# Patient Record
Sex: Male | Born: 1990 | Race: Black or African American | Hispanic: No | Marital: Single | Smoking: Current every day smoker
Health system: Southern US, Community
[De-identification: ages and names within clinical notes are randomized; demographics above are authoritative.]

---
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
## 2014-12-03 ENCOUNTER — Emergency Department (HOSPITAL_COMMUNITY)
Admission: EM | Admit: 2014-12-03 | Discharge: 2014-12-03 | Disposition: A | Discharge status: Home / Self Care | Payer: Self-pay | Attending: Emergency Medicine | Admitting: Emergency Medicine

## 2014-12-03 ENCOUNTER — Encounter (HOSPITAL_COMMUNITY): Payer: Self-pay | Admitting: Emergency Medicine

## 2014-12-03 ENCOUNTER — Emergency Department (HOSPITAL_COMMUNITY)
Admission: EM | Admit: 2014-12-03 | Discharge: 2014-12-04 | Disposition: A | Discharge status: Home / Self Care | Payer: Self-pay | Attending: Emergency Medicine | Admitting: Emergency Medicine

## 2014-12-03 ENCOUNTER — Encounter (HOSPITAL_COMMUNITY): Payer: Self-pay

## 2014-12-03 DIAGNOSIS — R6883 Chills (without fever): Secondary | ICD-10-CM | POA: Insufficient documentation

## 2014-12-03 DIAGNOSIS — Z720 Tobacco use: Secondary | ICD-10-CM | POA: Insufficient documentation

## 2014-12-03 DIAGNOSIS — F1120 Opioid dependence, uncomplicated: Secondary | ICD-10-CM | POA: Insufficient documentation

## 2014-12-03 DIAGNOSIS — F1910 Other psychoactive substance abuse, uncomplicated: Secondary | ICD-10-CM

## 2014-12-03 DIAGNOSIS — F1110 Opioid abuse, uncomplicated: Secondary | ICD-10-CM | POA: Insufficient documentation

## 2014-12-03 LAB — URINALYSIS, ROUTINE W REFLEX MICROSCOPIC
BILIRUBIN URINE: NEGATIVE
GLUCOSE, UA: NEGATIVE mg/dL
Hgb urine dipstick: NEGATIVE
Ketones, ur: NEGATIVE mg/dL
Leukocytes, UA: NEGATIVE
Nitrite: NEGATIVE
PROTEIN: NEGATIVE mg/dL
Specific Gravity, Urine: 1.014 (ref 1.005–1.030)
UROBILINOGEN UA: 0.2 mg/dL (ref 0.0–1.0)
pH: 7 (ref 5.0–8.0)

## 2014-12-03 LAB — COMPREHENSIVE METABOLIC PANEL
ALK PHOS: 63 U/L (ref 38–126)
ALT: 22 U/L (ref 17–63)
AST: 18 U/L (ref 15–41)
Albumin: 3.8 g/dL (ref 3.5–5.0)
Anion gap: 9 (ref 5–15)
BUN: 8 mg/dL (ref 6–20)
CALCIUM: 9.3 mg/dL (ref 8.9–10.3)
CO2: 24 mmol/L (ref 22–32)
CREATININE: 0.92 mg/dL (ref 0.61–1.24)
Chloride: 108 mmol/L (ref 101–111)
GFR calc Af Amer: >60 mL/min (ref >60)
GFR calc non Af Amer: >60 mL/min (ref >60)
Glucose, Bld: 100 mg/dL — ABNORMAL HIGH (ref 65–99)
Potassium: 3.9 mmol/L (ref 3.5–5.1)
Sodium: 141 mmol/L (ref 135–145)
Total Bilirubin: 0.7 mg/dL (ref 0.3–1.2)
Total Protein: 7.5 g/dL (ref 6.5–8.1)

## 2014-12-03 LAB — CBC WITH DIFFERENTIAL/PLATELET
BASOS ABS: 0 10*3/uL (ref 0.0–0.1)
BASOS PCT: 0 % (ref 0–1)
Eosinophils Absolute: 0.1 10*3/uL (ref 0.0–0.7)
Eosinophils Relative: 2 % (ref 0–5)
HCT: 40.4 % (ref 39.0–52.0)
HEMOGLOBIN: 13.5 g/dL (ref 13.0–17.0)
LYMPHS ABS: 2.6 10*3/uL (ref 0.7–4.0)
Lymphocytes Relative: 36 % (ref 12–46)
MCH: 26.4 pg (ref 26.0–34.0)
MCHC: 33.4 g/dL (ref 30.0–36.0)
MCV: 79.1 fL (ref 78.0–100.0)
MONO ABS: 0.6 10*3/uL (ref 0.1–1.0)
Monocytes Relative: 8 % (ref 3–12)
Neutro Abs: 3.9 10*3/uL (ref 1.7–7.7)
Neutrophils Relative %: 54 % (ref 43–77)
PLATELETS: 250 10*3/uL (ref 150–400)
RBC: 5.11 MIL/uL (ref 4.22–5.81)
RDW: 12.5 % (ref 11.5–15.5)
WBC: 7.2 10*3/uL (ref 4.0–10.5)

## 2014-12-03 LAB — RAPID URINE DRUG SCREEN, HOSP PERFORMED
AMPHETAMINES: NOT DETECTED
Barbiturates: NOT DETECTED
Benzodiazepines: NOT DETECTED
COCAINE: NOT DETECTED
Opiates: POSITIVE — AB
TETRAHYDROCANNABINOL: NOT DETECTED

## 2014-12-03 LAB — LIPASE, BLOOD: Lipase: 29 U/L (ref 22–51)

## 2014-12-03 MED ORDER — ONDANSETRON HCL 4 MG PO TABS: 4.0000 mg | ORAL_TABLET | Freq: Three times a day (TID) | ORAL | Status: DC | PRN

## 2014-12-03 MED ORDER — ACETAMINOPHEN 325 MG PO TABS
650.0000 mg | ORAL_TABLET | ORAL | Status: DC | PRN
  Administered 2014-12-03: 650 mg via ORAL
  Filled 2014-12-03: qty 2

## 2014-12-03 MED ORDER — NICOTINE 21 MG/24HR TD PT24
21.0000 mg | MEDICATED_PATCH | Freq: Every day | TRANSDERMAL | Status: DC
  Administered 2014-12-03: 21 mg via TRANSDERMAL
  Filled 2014-12-03: qty 1

## 2014-12-03 NOTE — ED Notes (Signed)
Patient here for heroine detox, last use yesterday. Pt states he also uses cocaine, but is here for heroine detox. Pt denies any drug or ETOH use today.

## 2014-12-03 NOTE — Discharge Instructions (Signed)
Drug Abuse and Addiction  There are many types of drugs that one may become addicted to including illegal drugs (marijuana, cocaine, amphetamines, hallucinogens, and narcotics), prescription drugs (hydrocodone, codeine, and alprazolam), and other chemicals such as alcohol or nicotine. Two types of addiction exist: physical and emotional. Physical addiction usually occurs after prolonged use of a drug. However, some drugs may only take a couple uses before addiction can occur. Physical addiction is marked by withdrawal symptoms in which the person experiences negative symptoms such as sweat, anxiety, tremors, hallucinations, or cravings in the absence of using the drug. Emotional dependence is the psychological desire for the "high" that the drugs produce when taken. SYMPTOMS   Inattentiveness.  Negligence.  Forgetfulness.  Insomnia.  Mood swings. RISK INCREASES WITH:   Family history of addiction.  Personal history of addictive personality. Studies have shown that risk takers, which many athletes are, have a higher risk of addiction. PREVENTION The only adequate prevention of drug abuse is abstinence from drugs. TREATMENT  The first step in quitting substance abuse is recognizing the problem and realizing that one has the power to change. Quitting requires a plan and support from others. It is often necessary to seek medical assistance. Caregivers are available to offer counseling, and for certain cases, medicine to diminish the physical symptoms of withdrawal. Many organizations exist such as Alcoholics Anonymous, Narcotics Anonymous, or the [employer] on Alcoholism that offer support for individuals who have chosen to quit their habits. Document Released: 06/15/2005 Document Revised: 10/30/2013 Document Reviewed: 09/27/2008 [facility] Patient Information 2015 [place], [place]. This information is not intended to replace advice given to you by your health care provider. Make sure you  discuss any questions you have with your health care provider.  Emergency Department Resource Guide 1) Find a Doctor and Pay Out of Pocket Although you won't have to find out who is covered by your insurance plan, it is a good idea to ask around and get recommendations. You will then need to call the office and see if the doctor you have chosen will accept you as a new patient and what types of options they offer for patients who are self-pay. Some doctors offer discounts or will set up payment plans for their patients who do not have insurance, but you will need to ask so you aren't surprised when you get to your appointment.  2) Contact Your Local Health Department Not all health departments have doctors that can see patients for sick visits, but many do, so it is worth a call to see if yours does. If you don't know where your local health department is, you can check in your phone book. The CDC also has a tool to help you locate your state's health department, and many state websites also have listings of all of their local health departments.  3) Find a Walk-in Clinic If your illness is not likely to be very severe or complicated, you may want to try a walk in clinic. These are popping up all over the country in pharmacies, drugstores, and shopping centers. They're usually staffed by nurse practitioners or physician assistants that have been trained to treat common illnesses and complaints. They're usually fairly quick and inexpensive. However, if you have serious medical issues or chronic medical problems, these are probably not your best option.  No Primary Care Doctor: - Call Health Connect at  [phone] - they can help you locate a primary care doctor that  accepts your insurance, provides certain services,  etc. - Physician Referral Service- [phone]  Chronic Pain Problems: Organization         Address  Phone   Notes  [facility]  [phone] Patients need to be  referred by their primary care doctor.   Medication Assistance: Organization         Address  Phone   Notes  [facility] Medication [facility] [address] [place], [place] [address] [phone] --Must be a resident of [facility] -- Must have NO insurance coverage whatsoever (no Medicaid/ Medicare, etc.) -- The pt. MUST have a primary care doctor that directs their care regularly and follows them in the community   [facility]  [phone]   [employer]  [phone]    Agencies that provide inexpensive medical care: Organization         Address  Phone   Notes  [facility]  [phone]   [facility]    [phone]   [facility] [address] [place], [place] [address] [phone]   [facility] [address] [place] [place] [phone]   Planned Parenthood    [phone]   [facility]    [phone]   [facility]  [address], [place] Phone:  [phone], Fax:  [phone] Hours of Operation:  9 am - 6 pm, M-F.  Also accepts Medicaid/Medicare and self-pay.  [facility]  [address], [place] Phone: [phone], Fax: [phone]. Hours of Operation:  8:30 am - 5:30 pm, M-F.  Also accepts Medicaid and self-pay.  [facility] [place] [address], [place] Phone: [phone]   [facility] [address] Natasha Bence [place], [place] [phone], Ext. [phone] Mondays & Thursdays: 7-9 AM.  First 15 patients are seen on a first come, first serve basis.    Medicaid-accepting [facility] Providers:  Organization         Address  Phone   Notes  [facility] [address], [place] [phone] Also accepts self-pay patients.  [facility] [address] Laurell Josephs [place], [place]  [phone]   [facility] [address], [place] [phone]   [facility] [address], [place] [phone]   Renaye Rakers [address], [place]   [phone] Only accepts [place] patients after they have their name applied to their card.   Self-Pay (no insurance) in [facility]:  Organization         Address  Phone   Notes  Sickle Cell Patients, [facility] [address] [place], [place] [phone]   [facility] Care [address] [place], [place] [phone]   [facility]  [address], [place] [phone]   [facility]/Dr. Osei-Bonsu  [address] [place] or [address], [place] [phone] Phone number for both [place] and [place] locations is the same.  [facility] [address] [place] [phone]   [facility] [address] [place] or [address] [phone]   [facility] [address] [place], [place] (  [phone], phone; [phone], fax Sees patients 1st and 3rd Saturday of every month.  Must not qualify for public or private insurance (i.e. Medicaid, Medicare, [place] Health Choice, Veterans' Benefits)  Household income should be no more than 200% of the poverty level The clinic cannot treat you if you are pregnant or think you are pregnant  Sexually transmitted diseases are not treated at the clinic.    Dental Care: Organization         Address  Phone  Notes  [facility] Department of [facility] [address] [place], [place] [phone] Accepts children up to age 58 who are enrolled in [place] or [place] Health Choice; pregnant women with a Medicaid card; and children who have applied for Medicaid or [place] Health Choice, but were declined, whose parents can  pay a reduced fee at time of service.  [facility] Department of [facility]  [address], [place] [phone] Accepts children up to age 77 who are enrolled in [place] or [place] Health Choice; pregnant women with a Medicaid card; and children who have applied for Medicaid or [place] Health Choice, but were declined, whose parents can pay a reduced fee at time of service.  [facility]  [address] [place], [place] [phone] Patients are seen by appointment only. Walk-ins are not accepted. [facility] will see patients 25 years of age and older. Monday - Tuesday (8am-5pm) Most Wednesdays (8:30-5pm) $30 per visit, cash only  [facility] Adult Dental Access PRO[facility]  [address], [facility] [phone] Patients are seen by appointment only. Walk-ins are not accepted. [facility] will see patients 63 years of age and older. One Wednesday Evening (Monthly: Volunteer Based).  $30 per visit, cash only  [employer]  [phone] for adults; Children under age 71, call Graduate Pediatric Dentistry at [phone]. Children aged 50-14, please call [phone] to request a pediatric application.  Dental services are provided in all areas of dental care including fillings, crowns and bridges, complete and partial dentures, implants, gum treatment, root canals, and extractions. Preventive care is also provided. Treatment is provided to both adults and children. Patients are selected via a lottery and there is often a waiting list.   [facility] [address] [place]  [phone] [email or web address]   [facility] [address] [place], [place] [phone], Ext. [phone] Second and Fourth Thursday of each month, opens at 6:30 AM; Clinic ends at 9 AM.  Patients are seen on a first-come first-served basis, and a limited number are seen during each clinic.   [facility]  [address] Ether Griffins [place], [place] [phone]   Eligibility Requirements You must have lived in [place], [place], or [place] counties for at least the last three months.   You cannot be eligible for state or federal sponsored [place], including [employer], [place], or [employer].   You generally cannot be eligible for healthcare insurance through your employer.    How to apply: Eligibility screenings are held every Tuesday and Wednesday afternoon from 1:00 pm until 4:00 pm. You do not need an appointment for the interview!  [facility] [address], [place], [place] [phone]   [facility] Health Department  [phone]   [facility] Health Department  [phone]   [facility] Health Department  [phone]    Behavioral Health  Resources in the Community: Intensive Outpatient Programs Organization         Address  Phone  Notes  [facility] [address], [place], [place] [phone]   [facility] [address], [place], [place] [phone]   [facility] [address], [place], [place]  [phone]   [facility] [address],  [place], [place] [phone] or [phone]   Substance Abuse Resources Organization         Address  Phone  Notes  [facility]  [phone]   [facility]  [phone]   [facility]  [phone]   [facility]  [phone]   Residential & Outpatient Substance Abuse Program  [phone]   Psychological Services Organization         Address  Phone  Notes  [facility]  [phone]- [phone]   [facility]  [phone]- [phone]   [facility] [address], [place] [phone] or [phone]    Mobile Crisis Teams Organization         Address  Phone  Notes  [facility], Mobile Crisis Care Unit  [phone]    [facility]  [address] [place], [place] [phone]   Doristine LocksSharon DeEsch [address] [place] [phone]    Self-Help/Support Groups Organization         Address  Phone             Notes  [facility] [place] - variety of support groups  336- [phone] Call for more information  Narcotics Anonymous (NA), [facility] [address], [place]  2 meetings at this location   Statisticianesidential Treatment Programs Organization         Address  Phone  Notes  [facility] [address],    [place]  [phone]   [facility]  [address], [place] [phone], [place], [place] [phone]   [facility] [address], [place] [phone] Admissions: 8am-3pm M-F  [facility] [address]    [place], [place] [phone]   [facility] [address], [place], [place] [phone]   [facility] [address]  [place], [place] [phone]   [facility] - Intensive Outpatient [address], [place], [place] [phone]   [facility] ([facility] [address]  [place], [place] [phone] or [phone]   [facility] ([facility]) [address] [place], [place] [phone] Accepts Medicaid  [facility] [address]  [place] [phone] Substance Abuse/Addiction Treatment   [facility] Resources Organization         Address  Phone  Notes  [facility]  [phone]) [phone]   Angie FavaJulie Brannon, PhD [address] [place], [place]   [phone]) [phone] or [phone]   [facility]   [address] [place], [place] [phone]) [phone]   [facility] [address], [place], [place] [phone] Insurance/Medicaid/sponsorship through [facility]  [facility] [address]                                     [place], [place] [phone] Therapy/tele-psych/case  [facility] [address]   [place], [place] (  [phone]    Dr. Lolly Mustache  [phone]   [facility]  [employer] [facility] Dept. 1) [address] [place] 2) [address] [place] 3)  [address], [place] [phone]  [phone]   [facility] Child Abuse Hotline [phone] or [phone] (After Hours)

## 2014-12-03 NOTE — ED Notes (Signed)
Pt was very upset and angry with PA, requesting more resources. Did not wait for discharge and follow up instructions

## 2014-12-03 NOTE — ED Provider Notes (Signed)
CSN: [id number]     Arrival date & time 12/03/14  1853 History   First MD Initiated Contact with Patient 12/03/14 2100     Chief Complaint  Patient presents with  . Detox Heroine      (Consider location/radiation/quality/duration/timing/severity/associated sxs/prior Treatment) HPI Comments: The patient returns to the ED for help with dependence on heroin. He has been using for the past 6 months intravenously. No complaint of pain or redness at injection sites. He last used yesterday. He is having symptoms of nausea, vomiting, myalgias and agitation. No fever.   The history is provided by the patient.    History reviewed. No pertinent past medical history. History reviewed. No pertinent past surgical history. No family history on file. History  Substance Use Topics  . Smoking status: Current Every Day Smoker -- 1.00 packs/day    Types: Cigarettes  . Smokeless tobacco: Not on file  . Alcohol Use: Yes    Review of Systems  Constitutional: Negative for fever and chills.  HENT: Negative.   Respiratory: Negative.   Cardiovascular: Negative.   Gastrointestinal: Positive for nausea, vomiting and abdominal pain.  Musculoskeletal: Positive for myalgias.  Skin: Negative.   Neurological: Negative.       Allergies  Review of patient's allergies indicates no known allergies.  Home Medications   Prior to Admission medications   Not on File   BP 135/85 mmHg  Temp(Src) 98.2 F (36.8 C) (Oral)  Resp 19  SpO2 99% Physical Exam  Constitutional: He is oriented to person, place, and time. He appears well-developed and well-nourished.  HENT:  Head: Normocephalic.  Neck: Normal range of motion. Neck supple.  Cardiovascular: Normal rate and regular rhythm.   Pulmonary/Chest: Effort normal and breath sounds normal.  Abdominal: Soft. Bowel sounds are normal. There is no tenderness. There is no rebound and no guarding.  Musculoskeletal: Normal range of motion.  Neurological: He is  alert and oriented to person, place, and time.  Skin: Skin is warm and dry. No rash noted.  Psychiatric: He has a normal mood and affect.    ED Course  Procedures (including critical care time) Labs Review Labs Reviewed  COMPREHENSIVE METABOLIC PANEL - Abnormal; Notable for the following:    Glucose, Bld 100 (*)    All other components within normal limits  CBC WITH DIFFERENTIAL/PLATELET  LIPASE, BLOOD  URINALYSIS, ROUTINE W REFLEX MICROSCOPIC (NOT AT [facility])  URINE RAPID DRUG SCREEN (HOSP PERFORMED) NOT AT [facility]    Imaging Review No results found.   EKG Interpretation None      MDM   Final diagnoses:  None    1. Heroin dependence  The patient returns to the ED for help with heroin addiction. He was seen earlier today for same and discharged with outpatient resources but returns because he feels he needs inpatient detox to avoid getting sicker or dying. It was discussed at length that a medical admission is not an option as the detox process from heroin is uncomfortable but not life threatening. He and family acknowledge understanding.  Discussed with Laquesta ([facility]S) who advises that after contacting [facility] she was informed they had beds available. Will attempt placement.    Elpidio AnisShari Nimesh Riolo, PA-C 12/03/14 2134  Gerhard Munchobert Lockwood, MD 12/03/14 2351

## 2014-12-03 NOTE — ED Notes (Signed)
Patient here for heroine detox, last use yesterday. Pt states he also uses cocaine, but is here for heroine detox. Pt denies any drug or ETOH use today. Pt seen for the same in ED early today, pt became agitated with PA upon discharge that he was not going to be admitted, pt spoke aggressively to PA and left prior to receiving discharge papers. Pt then called EMS to return to ED for heroine detox, abdominal pain, chills, and emesis. 4 mg zofran administered IV by EMS.

## 2014-12-03 NOTE — ED Notes (Signed)
Patient and family concerned patient is not receiving appropriate treatment as his friend is getting different care than he is. Explained to patient and family that based on the presentation if symptoms, history, and usage patients may receive different care or different resources. Patient and family satisfied with this explanation at this time. Offered food/beverage, patient declined. Updated patient and family after speaking to [place] that we are continuing to wait on a response from [facility] concerning placement.

## 2014-12-03 NOTE — ED Provider Notes (Signed)
CSN: [id number]     Arrival date & time 12/03/14  1638 History  This chart was scribed for non-physician practitioner, Catha GosselinHanna Patel-Mills, working with Gerhard Munchobert Lockwood, MD by Richarda Overlieichard Holland, ED Scribe. This patient was seen in room WTR5/WTR5 and the patient's care was started at 5:49 PM.   Chief Complaint  Patient presents with  . Detox - Heroine    The history is provided by the patient and a parent. No language interpreter was used.   HPI Comments: Erik Patterson is a 24 y.o. male who presents to the Emergency Department for a heroine detox. Pt states that his last dose of IV heroine was yesterday night at 5PM. Pt reports that he has also used crack and xanax in the past with his last dose of xanax being 2 days ago. Pt states that he had 1 beer last night. He denies any hx of tremors or seizures. He states that he does not "want medications, I just want to be monitored and get through it." He denies SI, HI or hallucinations.   History reviewed. No pertinent past medical history.   History reviewed. No pertinent past surgical history.   No family history on file.   History  Substance Use Topics  . Smoking status: Current Every Day Smoker -- 1.00 packs/day    Types: Cigarettes  . Smokeless tobacco: Not on file  . Alcohol Use: Yes    Review of Systems  Constitutional: Positive for chills.  Neurological: Negative for tremors and seizures.  Psychiatric/Behavioral: Negative for suicidal ideas, hallucinations, behavioral problems and agitation. The patient is not nervous/anxious.    Allergies  Review of patient's allergies indicates no known allergies.   Home Medications   Prior to Admission medications   Not on File   BP 137/83 mmHg  Temp(Src) 98.4 F (36.9 C) (Oral)  Resp 19  SpO2 97%   Physical Exam  Constitutional: He is oriented to person, place, and time. He appears well-developed and well-nourished.  HENT:  Head: Normocephalic and atraumatic.  Eyes: Conjunctivae are  normal.  Neck: Neck supple. No tracheal deviation present.  Cardiovascular: Normal rate, regular rhythm and normal heart sounds.   Pulmonary/Chest: Effort normal. No respiratory distress.  Abdominal: He exhibits no distension.  Musculoskeletal: Normal range of motion.  Neurological: He is alert and oriented to person, place, and time.  Skin: Skin is warm and dry.  Psychiatric: He has a normal mood and affect. His behavior is normal. Judgment and thought content normal. His mood appears not anxious. His affect is not angry. Cognition and memory are normal. He does not exhibit a depressed mood. He expresses no homicidal and no suicidal ideation. He expresses no suicidal plans and no homicidal plans.  Nursing note and vitals reviewed.   ED Course  Procedures   DIAGNOSTIC STUDIES: Oxygen Saturation is 97% on RA, normal by my interpretation.    COORDINATION OF CARE: 6:00 PM Discussed treatment plan with pt at bedside and pt agreed to plan.  Labs Review Labs Reviewed - No data to display  Imaging Review No results found.   EKG Interpretation None      MDM   Final diagnoses:  Polysubstance abuse  Patient presents with his mom for heroin detox.  His last use was yesterday.  He denies any regular use of benzodiazepines or alcohol.  I explained that he is currently asymptomatic and without tremors or seizures.  He states he has not had these in the past. He denies any SI, HI,  or hallucinations. I discussed giving him resources to get outpatient help for heroin detox. I also discussed that we no longer provide inpatient detox for drug or alcohol use.  Patient's mother states that she does not understand why her son cannot get help today and is upset with the system.  After a lengthy conversation about getting treatement, the patient's mother states she does not want more paper work and left with her son. They did not wait for discharge.   I personally performed the services described in  this documentation, which was scribed in my presence. The recorded information has been reviewed and is accurate.      Catha Gosselin, PA-C 12/03/14 2257  Gerhard Munch, MD 12/03/14 2351

## 2014-12-04 NOTE — ED Notes (Signed)
Per Andrey CampanileSandy at [facility] patient is ok to come to facility now. [facility] notified.

## 2014-12-04 NOTE — Discharge Instructions (Signed)
WILL TRANSFER BY [facility] TO [facility] [place].

## 2015-12-06 ENCOUNTER — Emergency Department
Admission: EM | Admit: 2015-12-06 | Discharge: 2015-12-06 | Disposition: A | Discharge status: Home / Self Care | Payer: Self-pay | Attending: Emergency Medicine | Admitting: Emergency Medicine

## 2015-12-06 ENCOUNTER — Encounter: Payer: Self-pay | Admitting: Emergency Medicine

## 2015-12-06 DIAGNOSIS — E860 Dehydration: Secondary | ICD-10-CM | POA: Insufficient documentation

## 2015-12-06 DIAGNOSIS — F17210 Nicotine dependence, cigarettes, uncomplicated: Secondary | ICD-10-CM | POA: Insufficient documentation

## 2015-12-06 DIAGNOSIS — F1490 Cocaine use, unspecified, uncomplicated: Secondary | ICD-10-CM | POA: Insufficient documentation

## 2015-12-06 DIAGNOSIS — R55 Syncope and collapse: Secondary | ICD-10-CM

## 2015-12-06 LAB — COMPREHENSIVE METABOLIC PANEL
ALT: 18 U/L (ref 17–63)
AST: 22 U/L (ref 15–41)
Albumin: 4.1 g/dL (ref 3.5–5.0)
Alkaline Phosphatase: 49 U/L (ref 38–126)
Anion gap: 9 (ref 5–15)
BUN: 11 mg/dL (ref 6–20)
CALCIUM: 9.2 mg/dL (ref 8.9–10.3)
CO2: 24 mmol/L (ref 22–32)
Chloride: 105 mmol/L (ref 101–111)
Creatinine, Ser: 1.3 mg/dL — ABNORMAL HIGH (ref 0.61–1.24)
GFR calc Af Amer: >60 mL/min (ref >60)
GFR calc non Af Amer: >60 mL/min (ref >60)
Glucose, Bld: 123 mg/dL — ABNORMAL HIGH (ref 65–99)
POTASSIUM: 3.2 mmol/L — AB (ref 3.5–5.1)
SODIUM: 138 mmol/L (ref 135–145)
TOTAL PROTEIN: 7.5 g/dL (ref 6.5–8.1)
Total Bilirubin: 1.5 mg/dL — ABNORMAL HIGH (ref 0.3–1.2)

## 2015-12-06 LAB — CBC WITH DIFFERENTIAL/PLATELET
BAND NEUTROPHILS: 5 %
BASOS ABS: 0 10*3/uL (ref 0–0.1)
BASOS PCT: 1 %
Blasts: 0 %
EOS PCT: 0 %
Eosinophils Absolute: 0 10*3/uL (ref 0–0.7)
HCT: 44.1 % (ref 40.0–52.0)
Hemoglobin: 14.5 g/dL (ref 13.0–18.0)
Lymphocytes Relative: 70 %
Lymphs Abs: 2.2 10*3/uL (ref 1.0–3.6)
MCH: 27.5 pg (ref 26.0–34.0)
MCHC: 32.8 g/dL (ref 32.0–36.0)
MCV: 84 fL (ref 80.0–100.0)
MONO ABS: 0.5 10*3/uL (ref 0.2–1.0)
MONOS PCT: 17 %
MYELOCYTES: 2 %
Metamyelocytes Relative: 1 %
NEUTROS ABS: 0.4 10*3/uL — AB (ref 1.4–6.5)
Neutrophils Relative %: 4 %
Other: 0 %
Platelets: 189 10*3/uL (ref 150–440)
Promyelocytes Absolute: 0 %
RBC: 5.25 MIL/uL (ref 4.40–5.90)
RDW: 13.8 % (ref 11.5–14.5)
WBC: 3.1 10*3/uL — ABNORMAL LOW (ref 3.8–10.6)
nRBC: 0 /100 WBC

## 2015-12-06 LAB — TROPONIN I: Troponin I: <0.03 ng/mL

## 2015-12-06 MED ORDER — IBUPROFEN 800 MG PO TABS
ORAL_TABLET | ORAL | Status: AC
  Filled 2015-12-06: qty 1

## 2015-12-06 MED ORDER — ACETAMINOPHEN 500 MG PO TABS
ORAL_TABLET | ORAL | Status: AC
  Administered 2015-12-06: 1000 mg via ORAL
  Filled 2015-12-06: qty 1

## 2015-12-06 MED ORDER — ACETAMINOPHEN 500 MG PO TABS
1000.0000 mg | ORAL_TABLET | Freq: Once | ORAL | Status: AC
  Administered 2015-12-06: 1000 mg via ORAL

## 2015-12-06 MED ORDER — IBUPROFEN 800 MG PO TABS
800.0000 mg | ORAL_TABLET | Freq: Once | ORAL | Status: AC
  Administered 2015-12-06: 800 mg via ORAL

## 2015-12-06 MED ORDER — SODIUM CHLORIDE 0.9 % IV SOLN
Freq: Once | INTRAVENOUS | Status: AC
  Administered 2015-12-06: 14:00:00 via INTRAVENOUS

## 2015-12-06 MED ORDER — PENICILLIN V POTASSIUM 250 MG PO TABS: 250.0000 mg | ORAL_TABLET | Freq: Four times a day (QID) | ORAL | Status: DC

## 2015-12-06 NOTE — ED Provider Notes (Signed)
[facility] Emergency Department Provider Note        Time seen: ----------------------------------------- 1:37 PM on 12/06/2015 -----------------------------------------    I have reviewed the triage vital signs and the nursing notes.   HISTORY  Chief Complaint No chief complaint on file.    HPI Erik Patterson is a [age] y.o. male who presents to ER for several near syncopal events while he was at the dentist. Patient states that he can taste the pus in his mouth, was concern he had dental abscess. He was cut have a Panorex done to assess for abscess at the dentist office. Patient and subsequently felt he was given a pass out several times. He had a hard time standing up. Currently he feels better. He denies any recent illness.   No past medical history on file.  There are no active problems to display for this patient.   No past surgical history on file.  Allergies Review of patient's allergies indicates no known allergies.  Social History Social History  Substance Use Topics  . Smoking status: Current Every Day Smoker -- 1.00 packs/day    Types: Cigarettes  . Smokeless tobacco: Not on file  . Alcohol Use: Yes    Review of Systems Constitutional: Negative for fever. Eyes: Negative for visual changes. ENT: Negative for sore throat. Positive for toothache Cardiovascular: Negative for chest pain. Respiratory: Negative for shortness of breath. Gastrointestinal: Negative for abdominal pain, vomiting and diarrhea. Genitourinary: Negative for dysuria. Musculoskeletal: Negative for back pain. Skin: Negative for rash. Neurological: Negative for headaches,Positive for weakness  10-point ROS otherwise negative.  ____________________________________________   PHYSICAL EXAM:  VITAL SIGNS: ED Triage Vitals  Enc Vitals Group     BP --      Pulse --      Resp --      Temp --      Temp src --      SpO2 --      Weight --      Height --       Head Cir --      Peak Flow --      Pain Score --      Pain Loc --      Pain Edu? --      Excl. in GC? --     Constitutional: Alert and oriented. Well appearing and in no distress. Eyes: Conjunctivae are normal. PERRL. Normal extraocular movements. ENT   Head: Normocephalic and atraumatic.   Nose: No congestion/rhinnorhea.   Mouth/Throat: Mucous membranes are moist. Generally good dentition, few dental caries, no frank abscess is noted.   Neck: No stridor. Cardiovascular: Normal rate, regular rhythm. No murmurs, rubs, or gallops. Respiratory: Normal respiratory effort without tachypnea nor retractions. Breath sounds are clear and equal bilaterally. No wheezes/rales/rhonchi. Gastrointestinal: Soft and nontender. Normal bowel sounds Musculoskeletal: Nontender with normal range of motion in all extremities. No lower extremity tenderness nor edema. Neurologic:  Normal speech and language. No gross focal neurologic deficits are appreciated.  Skin:  Skin is warm, dry and intact. No rash noted. Psychiatric: Mood and affect are normal. Speech and behavior are normal.  ____________________________________________  EKG: Interpreted by me.Sinus tachycardia with rate of 101 bpm, normal PR interval, normal QRS, normal QT interval. Normal axis. No evidence of acute infarction.  ____________________________________________  ED COURSE:  Pertinent labs & imaging results that were available during my care of the patient were reviewed by me and considered in my medical decision making (see chart  for details). Patient's no acute distress, will check basic labs, EKG and give saline bolus. ____________________________________________    LABS (pertinent positives/negatives)  Labs Reviewed  CBC WITH DIFFERENTIAL/PLATELET - Abnormal; Notable for the following:    WBC 3.1 (*)    All other components within normal limits  COMPREHENSIVE METABOLIC PANEL - Abnormal; Notable for the following:     Potassium 3.2 (*)    Glucose, Bld 123 (*)    Creatinine, Ser 1.30 (*)    Total Bilirubin 1.5 (*)    All other components within normal limits  TROPONIN I   ____________________________________________  FINAL ASSESSMENT AND PLAN  Near syncope  Plan: Patient with labs as dictated above. Labs reflect possible dehydration. We will advise close outpatient follow-up with his doctor. He does feel better after saline bolus here. He is stable for discharge.   Emily Filbert, MD   Note: This dictation was prepared with Dragon dictation. Any transcriptional errors that result from this process are unintentional   Emily Filbert, MD 12/06/15 1434

## 2015-12-06 NOTE — Discharge Instructions (Signed)
Near-Syncope Near-syncope (commonly known as near fainting) is sudden weakness, dizziness, or feeling like you might pass out. During an episode of near-syncope, you may also develop pale skin, have tunnel vision, or feel sick to your stomach (nauseous). Near-syncope may occur when getting up after sitting or while standing for a long time. It is caused by a sudden decrease in blood flow to the brain. This decrease can result from various causes or triggers, most of which are not serious. However, because near-syncope can sometimes be a sign of something serious, a medical evaluation is required. The specific cause is often not determined. HOME CARE INSTRUCTIONS  Monitor your condition for any changes. The following actions may help to alleviate any discomfort you are experiencing:  Have someone stay with you until you feel stable.  Lie down right away and prop your feet up if you start feeling like you might faint. Breathe deeply and steadily. Wait until all the symptoms have passed. Most of these episodes last only a few minutes. You may feel tired for several hours.   Drink enough fluids to keep your urine clear or pale yellow.   If you are taking blood pressure or heart medicine, get up slowly when seated or lying down. Take several minutes to sit and then stand. This can reduce dizziness.  Follow up with your health care provider as directed. SEEK IMMEDIATE MEDICAL CARE IF:   You have a severe headache.   You have unusual pain in the chest, abdomen, or back.   You are bleeding from the mouth or rectum, or you have black or tarry stool.   You have an irregular or very fast heartbeat.   You have repeated fainting or have seizure-like jerking during an episode.   You faint when sitting or lying down.   You have confusion.   You have difficulty walking.   You have severe weakness.   You have vision problems.  MAKE SURE YOU:   Understand these instructions.  Will  watch your condition.  Will get help right away if you are not doing well or get worse.   This information is not intended to replace advice given to you by your health care provider. Make sure you discuss any questions you have with your health care provider.   Document Released: 06/15/2005 Document Revised: 06/20/2013 Document Reviewed: 11/18/2012 Elsevier Interactive Patient Education 2016 Elsevier Inc.  Dehydration, Adult Dehydration means your body does not have as much fluid or water as it needs. It happens when you take in less fluid than you lose. Your kidneys, brain, and heart will not work properly without the right amount of fluids.  Dehydration can range from mild to severe. It should be treated right away to help prevent it from becoming severe. HOME CARE  Drink enough fluid to keep your pee (urine) clear or pale yellow.  Drink water or fluid slowly by taking small sips. You can also try sucking on ice cubes.  Have food or drinks that contain electrolytes. Examples include bananas and sports drinks.  Take over-the-counter and prescription medicines only as told by your doctor.  Prepare oral rehydration solution (ORS) according to the instructions that came with it. Take sips of ORS every 5 minutes until your pee returns to normal.  If you are throwing up (vomiting) or have watery poop (diarrhea), keep trying to drink water, ORS, or both.  If you have watery poop, avoid:  Drinks with caffeine.  Fruit juice.  Milk.  Carbonated  soft drinks.  Do not take salt tablets. This can lead to having too much sodium in your body (hypernatremia). GET HELP IF:  You cannot eat or drink without throwing up.  You have had mild watery poop for longer than 24 hours.  You have a fever. GET HELP RIGHT AWAY IF:   You have very strong thirst.  You have very bad watery poop.  You have not peed in 6-8 hours, or you have peed only a small amount of very dark pee.  You have  shriveled skin.  You are dizzy, confused, or both.   This information is not intended to replace advice given to you by your health care provider. Make sure you discuss any questions you have with your health care provider.   Document Released: 04/11/2009 Document Revised: 03/06/2015 Document Reviewed: 10/31/2014 Elsevier Interactive Patient Education [employer] Else[employer] Inc[employer]

## 2015-12-06 NOTE — ED Notes (Signed)
Pt here via EMS from dentist office with c/o near syncopal episode this afternoon, states he never went "completely out," was able to sit down when he felt the dizziness coming on. Pt appears in no distress, only complaint is abscess pain from teeth.

## 2017-02-11 ENCOUNTER — Emergency Department (HOSPITAL_COMMUNITY): Payer: Self-pay

## 2017-02-11 ENCOUNTER — Encounter (HOSPITAL_COMMUNITY): Payer: Self-pay | Admitting: Emergency Medicine

## 2017-02-11 ENCOUNTER — Emergency Department (HOSPITAL_COMMUNITY)
Admission: EM | Admit: 2017-02-11 | Discharge: 2017-02-11 | Disposition: A | Discharge status: Home / Self Care | Payer: Self-pay | Attending: Emergency Medicine | Admitting: Emergency Medicine

## 2017-02-11 DIAGNOSIS — F17210 Nicotine dependence, cigarettes, uncomplicated: Secondary | ICD-10-CM | POA: Insufficient documentation

## 2017-02-11 DIAGNOSIS — Z79899 Other long term (current) drug therapy: Secondary | ICD-10-CM | POA: Insufficient documentation

## 2017-02-11 DIAGNOSIS — W19XXXA Unspecified fall, initial encounter: Secondary | ICD-10-CM

## 2017-02-11 DIAGNOSIS — Y999 Unspecified external cause status: Secondary | ICD-10-CM | POA: Insufficient documentation

## 2017-02-11 DIAGNOSIS — W11XXXA Fall on and from ladder, initial encounter: Secondary | ICD-10-CM | POA: Insufficient documentation

## 2017-02-11 DIAGNOSIS — Y929 Unspecified place or not applicable: Secondary | ICD-10-CM | POA: Insufficient documentation

## 2017-02-11 DIAGNOSIS — M542 Cervicalgia: Secondary | ICD-10-CM | POA: Insufficient documentation

## 2017-02-11 DIAGNOSIS — Y9389 Activity, other specified: Secondary | ICD-10-CM | POA: Insufficient documentation

## 2017-02-11 MED ORDER — METHOCARBAMOL 500 MG PO TABS: 500.0000 mg | ORAL_TABLET | Freq: Two times a day (BID) | ORAL | 0 refills | Status: DC

## 2017-02-11 MED ORDER — IBUPROFEN 600 MG PO TABS: 600.0000 mg | ORAL_TABLET | Freq: Four times a day (QID) | ORAL | 0 refills | Status: DC | PRN

## 2017-02-11 MED ORDER — LIDOCAINE 5 % EX PTCH: 1.0000 | MEDICATED_PATCH | CUTANEOUS | 0 refills | Status: DC

## 2017-02-11 MED ORDER — KETOROLAC TROMETHAMINE 60 MG/2ML IM SOLN
60.0000 mg | Freq: Once | INTRAMUSCULAR | Status: AC
  Administered 2017-02-11: 60 mg via INTRAMUSCULAR
  Filled 2017-02-11: qty 2

## 2017-02-11 MED ORDER — METHOCARBAMOL 500 MG PO TABS
1000.0000 mg | ORAL_TABLET | Freq: Once | ORAL | Status: AC
  Administered 2017-02-11: 1000 mg via ORAL
  Filled 2017-02-11: qty 2

## 2017-02-11 NOTE — ED Triage Notes (Signed)
Patient here from work with complaints of fall off ladder today. Pain to left shoulder radiating into back. Denies LOC.

## 2017-02-11 NOTE — Discharge Instructions (Signed)
Expect your soreness to increase over the next 2-3 days. Take it easy, but do not lay around too much as this may make any stiffness worse.  Antiinflammatory medications: Take 600 mg of ibuprofen every 6 hours or 440 mg (over the counter dose) to 500 mg (prescription dose) of naproxen every 12 hours or for the next 3 days. After this time, these medications may be used as needed for pain. Take these medications with food to avoid upset stomach. Choose only one of these medications, do not take them together.  Tylenol: Should you continue to have additional pain while taking the ibuprofen or naproxen, you may add in tylenol as needed. Your daily total maximum amount of tylenol from all sources should be limited to 4000mg /day for persons without liver problems, or 2000mg /day for those with liver problems. Muscle relaxer: Robaxin is a muscle relaxer and may help loosen stiff muscles. Do not take the Robaxin while driving or performing other dangerous activities.  Lidocaine patches: These are available via either prescription or over-the-counter. The over-the-counter option may be more economical one and are likely just as effective. There are multiple over-the-counter brands, such as Salonpas. Exercises: Be sure to perform the attached exercises starting with three times a week and working up to performing them daily. This is an essential part of preventing long term problems.   Follow up with a primary care provider for any future management of these complaints. Return to the ED should symptoms worsen.

## 2017-02-11 NOTE — ED Provider Notes (Signed)
[facility]-EMERGENCY DEPT Provider Note   CSN: [id number] Arrival date & time: 02/11/17  1328   By signing my name below, I, Erik Patterson, attest that this documentation has been prepared under the direction and in the presence of Erik Kohl, PA-C Electronically Signed: Soijett Patterson, ED Scribe. 02/11/17. 6:11 PM.  History   Chief Complaint Chief Complaint  Patient presents with  . Fall  . Shoulder Pain    HPI Erik Patterson is a 26 y.o. male who presents to the Emergency Department complaining of right sided neck pain s/p fall onset today. Patient states that he was "maybe halfway up" a 20 foot ladder cutting trees, when he lost his balance and fell onto grass on his left side. His pain is rated 7/10, described as a tightness and soreness, nonradiating. Pain is worse with movement. He has not taken any medications for his symptoms. He denies hitting his head, LOC, N/V, CP, SOB, back pain, neuro deficits, or any other complaints.   Denies anticoagulant use.     The history is provided by the patient. No language interpreter was used.    History reviewed. No pertinent past medical history.  There are no active problems to display for this patient.   History reviewed. No pertinent surgical history.     Home Medications    Prior to Admission medications   Medication Sig Start Date End Date Taking? Authorizing Provider  ibuprofen (ADVIL,MOTRIN) 600 MG tablet Take 1 tablet (600 mg total) by mouth every 6 (six) hours as needed. 02/11/17   Layan Zalenski C, PA-C  lidocaine (LIDODERM) 5 % Place 1 patch onto the skin daily. Remove & Discard patch within 12 hours or as directed by MD 02/11/17   Viyaan Champine C, PA-C  methocarbamol (ROBAXIN) 500 MG tablet Take 1 tablet (500 mg total) by mouth 2 (two) times daily. 02/11/17   Onika Gudiel C, PA-C  penicillin v potassium (VEETID) 250 MG tablet Take 1 tablet (250 mg total) by mouth 4 (four) times daily. 12/06/15   Emily Filbert, MD    Family  History No family history on file.  Social History Social History  Substance Use Topics  . Smoking status: Current Every Day Smoker    Packs/day: 1.00    Types: Cigarettes  . Smokeless tobacco: Never Used  . Alcohol use Yes     Allergies   Patient has no known allergies.   Review of Systems Review of Systems  HENT: Negative for facial swelling and trouble swallowing.   Respiratory: Negative for shortness of breath.   Cardiovascular: Negative for chest pain.  Gastrointestinal: Negative for abdominal pain, nausea and vomiting.  Musculoskeletal: Positive for neck pain (left sided). Negative for back pain.  Skin: Negative for color change and wound.  Neurological: Negative for dizziness, syncope, weakness, light-headedness, numbness and headaches.  All other systems reviewed and are negative.    Physical Exam Updated Vital Signs BP (!) 156/101 (BP Location: Right Arm)   Pulse (!) 101   Temp 98.5 F (36.9 C)   Resp 18   SpO2 99%   Physical Exam  Constitutional: He is oriented to person, place, and time. He appears well-developed and well-nourished. No distress.  HENT:  Head: Normocephalic and atraumatic.  Right Ear: Tympanic membrane, external ear and ear canal normal. No hemotympanum.  Left Ear: Tympanic membrane, external ear and ear canal normal. No hemotympanum.  Eyes: Pupils are equal, round, and reactive to light. Conjunctivae and EOM are normal.  Neck: Normal  range of motion. Neck supple.  Patient placed in C-collar after initial exam. ROM testing performed after clear head and cervical spine CT.  Cardiovascular: Normal rate, regular rhythm, normal heart sounds and intact distal pulses.   Pulmonary/Chest: Effort normal and breath sounds normal. No respiratory distress.  Abdominal: Soft. There is no tenderness. There is no guarding.  Musculoskeletal: He exhibits tenderness. He exhibits no edema.       Cervical back: He exhibits tenderness. He exhibits no bony  tenderness, no swelling and no deformity.  Tenderness to left cervical musculature. No step-offs, swelling, or deformities noted. No tenderness to clavicles or BUE. FROM in hands, wrists, elbows, and shoulders.   Normal motor function intact in all extremities and spine. No midline spinal tenderness.  Overall trauma exam performed without noted abnormalities other than those listed.  Neurological: He is alert and oriented to person, place, and time. He has normal strength. No sensory deficit. Gait normal.  No sensory deficits. Strength 5/5 in all extremities. No gait disturbance. Coordination intact including heel to shin and finger to nose. Cranial nerves III-XII grossly intact. No facial droop.   Skin: Skin is warm and dry. Capillary refill takes less than 2 seconds. He is not diaphoretic.  Psychiatric: He has a normal mood and affect. His behavior is normal.  Nursing note and vitals reviewed.    ED Treatments / Results  DIAGNOSTIC STUDIES: Oxygen Saturation is 99% on RA, nl by my interpretation.    COORDINATION OF CARE: 2:50 PM Discussed treatment plan with pt at bedside and pt agreed to plan.    Labs (all labs ordered are listed, but only abnormal results are displayed) Labs Reviewed - No data to display  EKG  EKG Interpretation None       Radiology Dg Thoracic Spine 2 View  Result Date: 02/11/2017 CLINICAL DATA:  Thoracic spine pain since a fall from a ladder today. Initial encounter. EXAM: THORACIC SPINE 2 VIEWS COMPARISON:  None. FINDINGS: There is no evidence of thoracic spine fracture. Alignment is normal. No other significant bone abnormalities are identified. IMPRESSION: Normal exam. Electronically Signed   By: Drusilla Kannerhomas  Dalessio M.D.   On: 02/11/2017 15:34   Dg Lumbar Spine Complete  Result Date: 02/11/2017 CLINICAL DATA:  Lumbar spine pain since a fall from a ladder today. Initial encounter. EXAM: LUMBAR SPINE - COMPLETE 4+ VIEW COMPARISON:  None. FINDINGS: There is  no evidence of lumbar spine fracture. Alignment is normal. Intervertebral disc spaces are maintained. IMPRESSION: Normal exam. Electronically Signed   By: Drusilla Kannerhomas  Dalessio M.D.   On: 02/11/2017 15:34   Ct Head Wo Contrast  Result Date: 02/11/2017 CLINICAL DATA:  Pain following fall from ladder EXAM: CT HEAD WITHOUT CONTRAST CT CERVICAL SPINE WITHOUT CONTRAST TECHNIQUE: Multidetector CT imaging of the head and cervical spine was performed following the standard protocol without intravenous contrast. Multiplanar CT image reconstructions of the cervical spine were also generated. COMPARISON:  None. FINDINGS: CT HEAD FINDINGS Brain: The ventricles are normal in size and configuration. There is no intracranial mass, hemorrhage, extra-axial fluid collection, or midline shift. The gray-white compartments appear normal. No evident acute infarct. Vascular: No hyperdense vessel. No vascular calcifications are appreciable. Skull: Bony calvarium appears intact. Sinuses/Orbits: Visualized paranasal sinuses are clear. Visualized orbits appear symmetric bilaterally. Other: Mastoid air cells are clear. CT CERVICAL SPINE FINDINGS Alignment: There is no spondylolisthesis. Skull base and vertebrae: Skull base and craniocervical junction regions appear normal. No evident fracture. No blastic or lytic bone lesions.  Soft tissues and spinal canal: Prevertebral soft tissues and predental space regions are normal. No appreciable paraspinous lesion. No cord or canal hematoma. Disc levels: Disc spaces appear unremarkable. No nerve root edema or effacement. No disc extrusion or stenosis. Upper chest: Visualized upper lung regions appear clear. Other: None IMPRESSION: CT head:  Study within normal limits. CT cervical spine: No fracture or spondylolisthesis. No evident arthropathy. Electronically Signed   By: Bretta Bang III M.D.   On: 02/11/2017 15:20   Ct Cervical Spine Wo Contrast  Result Date: 02/11/2017 CLINICAL DATA:  Pain  following fall from ladder EXAM: CT HEAD WITHOUT CONTRAST CT CERVICAL SPINE WITHOUT CONTRAST TECHNIQUE: Multidetector CT imaging of the head and cervical spine was performed following the standard protocol without intravenous contrast. Multiplanar CT image reconstructions of the cervical spine were also generated. COMPARISON:  None. FINDINGS: CT HEAD FINDINGS Brain: The ventricles are normal in size and configuration. There is no intracranial mass, hemorrhage, extra-axial fluid collection, or midline shift. The gray-white compartments appear normal. No evident acute infarct. Vascular: No hyperdense vessel. No vascular calcifications are appreciable. Skull: Bony calvarium appears intact. Sinuses/Orbits: Visualized paranasal sinuses are clear. Visualized orbits appear symmetric bilaterally. Other: Mastoid air cells are clear. CT CERVICAL SPINE FINDINGS Alignment: There is no spondylolisthesis. Skull base and vertebrae: Skull base and craniocervical junction regions appear normal. No evident fracture. No blastic or lytic bone lesions. Soft tissues and spinal canal: Prevertebral soft tissues and predental space regions are normal. No appreciable paraspinous lesion. No cord or canal hematoma. Disc levels: Disc spaces appear unremarkable. No nerve root edema or effacement. No disc extrusion or stenosis. Upper chest: Visualized upper lung regions appear clear. Other: None IMPRESSION: CT head:  Study within normal limits. CT cervical spine: No fracture or spondylolisthesis. No evident arthropathy. Electronically Signed   By: Bretta Bang III M.D.   On: 02/11/2017 15:20    Procedures Procedures (including critical care time)  Medications Ordered in ED Medications  ketorolac (TORADOL) injection 60 mg (60 mg Intramuscular Given 02/11/17 1556)  methocarbamol (ROBAXIN) tablet 1,000 mg (1,000 mg Oral Given 02/11/17 1556)     Initial Impression / Assessment and Plan / ED Course  I have reviewed the triage vital  signs and the nursing notes.  Pertinent labs & imaging results that were available during my care of the patient were reviewed by me and considered in my medical decision making (see chart for details).     Patient presents following a fall. Patient presents with neck pain. No acute abnormalities on imaging. No neuro or functional deficits. PCP versus orthopedic follow-up. Resources given. The patient was given instructions for home care as well as return precautions. Patient voices understanding of these instructions, accepts the plan, and is comfortable with discharge.    Final Clinical Impressions(s) / ED Diagnoses   Final diagnoses:  Fall, initial encounter  Neck pain    New Prescriptions Discharge Medication List as of 02/11/2017  4:07 PM    START taking these medications   Details  ibuprofen (ADVIL,MOTRIN) 600 MG tablet Take 1 tablet (600 mg total) by mouth every 6 (six) hours as needed., Starting Thu 02/11/2017, Print    lidocaine (LIDODERM) 5 % Place 1 patch onto the skin daily. Remove & Discard patch within 12 hours or as directed by MD, Starting Thu 02/11/2017, Print    methocarbamol (ROBAXIN) 500 MG tablet Take 1 tablet (500 mg total) by mouth 2 (two) times daily., Starting Thu 02/11/2017, Print  I personally performed the services described in this documentation, which was scribed in my presence. The recorded information has been reviewed and is accurate.    Concepcion Living 02/12/17 Reginia Naas, MD 02/12/17 [phone]

## 2017-06-06 ENCOUNTER — Encounter (HOSPITAL_COMMUNITY): Payer: Self-pay | Admitting: Emergency Medicine

## 2017-06-06 ENCOUNTER — Emergency Department (HOSPITAL_COMMUNITY)
Admission: EM | Admit: 2017-06-06 | Discharge: 2017-06-07 | Disposition: A | Discharge status: Home / Self Care | Payer: Self-pay | Attending: Emergency Medicine | Admitting: Emergency Medicine

## 2017-06-06 DIAGNOSIS — Z046 Encounter for general psychiatric examination, requested by authority: Secondary | ICD-10-CM | POA: Insufficient documentation

## 2017-06-06 DIAGNOSIS — F1994 Other psychoactive substance use, unspecified with psychoactive substance-induced mood disorder: Secondary | ICD-10-CM | POA: Diagnosis present

## 2017-06-06 DIAGNOSIS — R4587 Impulsiveness: Secondary | ICD-10-CM | POA: Insufficient documentation

## 2017-06-06 DIAGNOSIS — F17210 Nicotine dependence, cigarettes, uncomplicated: Secondary | ICD-10-CM | POA: Insufficient documentation

## 2017-06-06 DIAGNOSIS — Y908 Blood alcohol level of 240 mg/100 ml or more: Secondary | ICD-10-CM | POA: Insufficient documentation

## 2017-06-06 DIAGNOSIS — Z6379 Other stressful life events affecting family and household: Secondary | ICD-10-CM | POA: Insufficient documentation

## 2017-06-06 DIAGNOSIS — R451 Restlessness and agitation: Secondary | ICD-10-CM | POA: Insufficient documentation

## 2017-06-06 DIAGNOSIS — Z79899 Other long term (current) drug therapy: Secondary | ICD-10-CM | POA: Insufficient documentation

## 2017-06-06 LAB — CBC WITH DIFFERENTIAL/PLATELET
BASOS PCT: 0 %
Basophils Absolute: 0 10*3/uL (ref 0.0–0.1)
EOS ABS: 0 10*3/uL (ref 0.0–0.7)
Eosinophils Relative: 0 %
HCT: 46.9 % (ref 39.0–52.0)
Hemoglobin: 16.5 g/dL (ref 13.0–17.0)
LYMPHS ABS: 2.4 10*3/uL (ref 0.7–4.0)
Lymphocytes Relative: 39 %
MCH: 29.4 pg (ref 26.0–34.0)
MCHC: 35.2 g/dL (ref 30.0–36.0)
MCV: 83.6 fL (ref 78.0–100.0)
Monocytes Absolute: 0.3 10*3/uL (ref 0.1–1.0)
Monocytes Relative: 5 %
NEUTROS PCT: 56 %
Neutro Abs: 3.4 10*3/uL (ref 1.7–7.7)
PLATELETS: 278 10*3/uL (ref 150–400)
RBC: 5.61 MIL/uL (ref 4.22–5.81)
RDW: 14.1 % (ref 11.5–15.5)
WBC: 6.1 10*3/uL (ref 4.0–10.5)

## 2017-06-06 LAB — RAPID URINE DRUG SCREEN, HOSP PERFORMED
Amphetamines: NOT DETECTED
BARBITURATES: NOT DETECTED
Benzodiazepines: NOT DETECTED
Cocaine: NOT DETECTED
Opiates: NOT DETECTED
Tetrahydrocannabinol: NOT DETECTED

## 2017-06-06 NOTE — ED Triage Notes (Addendum)
Per [employer], patient from home, where he was found laying in the snow. Patient incontinent of urine. Patient is intoxicated.

## 2017-06-06 NOTE — ED Notes (Signed)
EDP at bedside  

## 2017-06-06 NOTE — ED Notes (Signed)
Writer unable to obtain labs after two attempts.

## 2017-06-06 NOTE — ED Notes (Signed)
Bed: [facility] Expected date:  Expected time:  Means of arrival:  Comments: 

## 2017-06-06 NOTE — ED Provider Notes (Signed)
[facility]-EMERGENCY DEPT Provider Note   CSN: [id number] Arrival date & time: 06/06/17  2127     History   Chief Complaint Chief Complaint  Patient presents with  . Alcohol Intoxication    HPI Erik Patterson is a 26 y.o. male.  26 year old male here under IVC by [employer] due to being found outside with alcohol on board after his father would not let him back in his house.  Patient is not cooperative at this time.  When I answer questions fully.  I did speak with the patient's sister who states that the patient lives with her father who has mental issues and father and the patient have not been getting along.  [employer] was called and placed patient in IVC due to him being a danger to himself by being outside in the cold.      History reviewed. No pertinent past medical history.  There are no active problems to display for this patient.   History reviewed. No pertinent surgical history.     Home Medications    Prior to Admission medications   Medication Sig Start Date End Date Taking? Authorizing Provider  ibuprofen (ADVIL,MOTRIN) 600 MG tablet Take 1 tablet (600 mg total) by mouth every 6 (six) hours as needed. 02/11/17   Joy, Shawn C, PA-C  lidocaine (LIDODERM) 5 % Place 1 patch onto the skin daily. Remove & Discard patch within 12 hours or as directed by MD 02/11/17   Joy, Shawn C, PA-C  methocarbamol (ROBAXIN) 500 MG tablet Take 1 tablet (500 mg total) by mouth 2 (two) times daily. 02/11/17   Joy, Shawn C, PA-C  penicillin v potassium (VEETID) 250 MG tablet Take 1 tablet (250 mg total) by mouth 4 (four) times daily. 12/06/15   Emily FilbertWilliams, Jonathan E, MD    Family History No family history on file.  Social History Social History   Tobacco Use  . Smoking status: Current Every Day Smoker    Packs/day: 1.00    Types: Cigarettes  . Smokeless tobacco: Never Used  Substance Use Topics  . Alcohol use: Yes  . Drug use: Yes    Types: Cocaine      Allergies   Patient has no known allergies.   Review of Systems Review of Systems  All other systems reviewed and are negative.    Physical Exam Updated Vital Signs There were no vitals taken for this visit.  Physical Exam  Constitutional: He is oriented to person, place, and time. He appears well-developed and well-nourished.  Non-toxic appearance. No distress.  HENT:  Head: Normocephalic and atraumatic.  Eyes: Conjunctivae, EOM and lids are normal. Pupils are equal, round, and reactive to light.  Neck: Normal range of motion. Neck supple. No tracheal deviation present. No thyroid mass present.  Cardiovascular: Normal rate, regular rhythm and normal heart sounds. Exam reveals no gallop.  No murmur heard. Pulmonary/Chest: Effort normal and breath sounds normal. No stridor. No respiratory distress. He has no decreased breath sounds. He has no wheezes. He has no rhonchi. He has no rales.  Abdominal: Soft. Normal appearance and bowel sounds are normal. He exhibits no distension. There is no tenderness. There is no rebound and no CVA tenderness.  Musculoskeletal: Normal range of motion. He exhibits no edema or tenderness.  Neurological: He is alert and oriented to person, place, and time. He has normal strength. No cranial nerve deficit or sensory deficit. GCS eye subscore is 4. GCS verbal subscore is 5. GCS motor  subscore is 6.  Skin: Skin is warm and dry. No abrasion and no rash noted.  Psychiatric: His affect is labile. He is agitated and aggressive. He expresses impulsivity. He is inattentive.  Nursing note and vitals reviewed.    ED Treatments / Results  Labs (all labs ordered are listed, but only abnormal results are displayed) Labs Reviewed  ETHANOL  CBC WITH DIFFERENTIAL/PLATELET  COMPREHENSIVE METABOLIC PANEL  RAPID URINE DRUG SCREEN, HOSP PERFORMED  SALICYLATE LEVEL  ACETAMINOPHEN LEVEL    EKG  EKG Interpretation None       Radiology No results  found.  Procedures Procedures (including critical care time)  Medications Ordered in ED Medications - No data to display   Initial Impression / Assessment and Plan / ED Course  I have reviewed the triage vital signs and the nursing notes.  Pertinent labs & imaging results that were available during my care of the patient were reviewed by me and considered in my medical decision making (see chart for details).     Patient is medical clearance labs are pending at this time.  Does appear to be intoxicated with alcohol or some other substance.  Will have behavioral health see once clinically sober Final Clinical Impressions(s) / ED Diagnoses   Final diagnoses:  None    ED Discharge Orders    None       Lorre NickAllen, Fordyce Lepak, MD 06/06/17 2156

## 2017-06-06 NOTE — ED Triage Notes (Signed)
Mindi JunkerDenise Collings, patient's sister, [phone]

## 2017-06-06 NOTE — ED Notes (Signed)
Unable to obtain vital signs. Patient chewing on thermometer probe and taking off pulse oximeter monitor.

## 2017-06-07 ENCOUNTER — Encounter (HOSPITAL_COMMUNITY): Payer: Self-pay | Admitting: Emergency Medicine

## 2017-06-07 DIAGNOSIS — F1994 Other psychoactive substance use, unspecified with psychoactive substance-induced mood disorder: Secondary | ICD-10-CM | POA: Diagnosis present

## 2017-06-07 LAB — SALICYLATE LEVEL

## 2017-06-07 LAB — COMPREHENSIVE METABOLIC PANEL
ALT: 29 U/L (ref 17–63)
ANION GAP: 14 (ref 5–15)
AST: 33 U/L (ref 15–41)
Albumin: 4.9 g/dL (ref 3.5–5.0)
Alkaline Phosphatase: 74 U/L (ref 38–126)
BILIRUBIN TOTAL: 0.4 mg/dL (ref 0.3–1.2)
BUN: 13 mg/dL (ref 6–20)
CO2: 19 mmol/L — ABNORMAL LOW (ref 22–32)
Calcium: 9.5 mg/dL (ref 8.9–10.3)
Chloride: 112 mmol/L — ABNORMAL HIGH (ref 101–111)
Creatinine, Ser: 0.77 mg/dL (ref 0.61–1.24)
Glucose, Bld: 112 mg/dL — ABNORMAL HIGH (ref 65–99)
POTASSIUM: 3.4 mmol/L — AB (ref 3.5–5.1)
Sodium: 145 mmol/L (ref 135–145)
TOTAL PROTEIN: 9.3 g/dL — AB (ref 6.5–8.1)

## 2017-06-07 LAB — ACETAMINOPHEN LEVEL

## 2017-06-07 LAB — ETHANOL: Alcohol, Ethyl (B): 295 mg/dL — ABNORMAL HIGH (ref <10)

## 2017-06-07 NOTE — [facility] Counselor (Signed)
Dr Juanetta BeetsJacqueline Norman rescinded pt's IVC. Writer placed original paperwork in IVC log and paper copy in pt's chart. Pt to be d/c. ED staff working on transportation for pt back home d/t inclement weather.   Evette Cristalaroline Paige Bari Leib, Kentucky[place]CS[place] Therapeutic Triage Specialist

## 2017-06-07 NOTE — Progress Notes (Signed)
CSW spoke with patient via bedside-patient reports missing belongings that are preventing him from discharging. Patient missing cell phone/ wallet- security informed.   Stacy GardnerErin Soraida Vickers, [facility] Regional Medical CenterCSWA Emergency Room Clinical Social Worker [phone]

## 2017-06-07 NOTE — [facility] Suicide Risk Assessment (Signed)
Suicide Risk Assessment  Discharge Assessment   [facility]   Principal Problem: Substance induced mood disorder [facility](HCC) Discharge Diagnoses:  Patient Active Problem List   Diagnosis Date Noted  . Substance induced mood disorder (HCC) [F19.94] 06/07/2017    Total Time spent with patient: 30 minutes  Musculoskeletal: Strength & Muscle Tone: within normal limits Gait & Station: normal Patient leans: N/A  Psychiatric Specialty Exam:   Blood pressure 128/86, pulse 75, temperature 98 F (36.7 C), temperature source Oral, resp. rate 18, SpO2 100 %.There is no height or weight on file to calculate BMI.  General Appearance: Casual  Eye Contact::  Good  Speech:  Clear and Coherent and Normal Rate409  Volume:  Normal  Mood:  Euthymic  Affect:  Congruent  Thought Process:  Coherent, Goal Directed and Linear  Orientation:  Full (Time, Place, and Person)  Thought Content:  Logical  Suicidal Thoughts:  No  Homicidal Thoughts:  No  Memory:  Immediate;   Good Recent;   Good Remote;   Fair  Judgement:  Fair  Insight:  Fair  Psychomotor Activity:  Normal  Concentration:  Good  Recall:  Good  Fund of Knowledge:Good  Language: Good  Akathisia:  No  Handed:  Right  AIMS (if indicated):     Assets:  ArchitectCommunication Skills Financial Resources/Insurance Housing Physical Health Social Support  Sleep:     Cognition: WNL  ADL's:  Intact   Mental Status Per Nursing Assessment::   On Admission:   intoxicated  Demographic Factors:  Male  Loss Factors: Financial problems/change in socioeconomic status  Historical Factors: Impulsivity  Risk Reduction Factors:   Sense of responsibility to family  Continued Clinical Symptoms:  Alcohol/Substance Abuse/Dependencies  Cognitive Features That Contribute To Risk:  Closed-mindedness    Suicide Risk:  Minimal: No identifiable suicidal ideation.  Patients presenting with no risk factors but with morbid  ruminations; may be classified as minimal risk based on the severity of the depressive symptoms    Plan Of Care/Follow-up recommendations:  Activity:  as tolerated Diet:  Heart Healthy  Laveda AbbeLaurie Britton Parks, NP 06/07/2017, 1:06 PM

## 2017-06-07 NOTE — ED Provider Notes (Addendum)
26 year old male signed out to me pending TTS consultation.  He was significantly intoxicated with ethanol level 295.  I reevaluated him at 4:30 AM, and he is still too somnolent for TTS evaluation.  We will continue to observe and arrange for TTS consultation once patient is able to respond to questions.   Dione BoozeGlick, Rhonda Linan, MD 06/07/17 [phone]  7:07 AM Patient now awake and conversant.  TTS consultation is requested.    Dione BoozeGlick, Braxton Weisbecker, MD 06/07/17 [phone]

## 2017-06-07 NOTE — BH Assessment (Addendum)
Assessment Note  Erik Patterson is an 26 y.o. male. Pt presents under IVC taken out by [facility] [employer] officer. Per IVC, "Respondent has been standing in the snow and rain intoxicated for approximately an hour with no shoes on.He is locked out of the house by his dad"  Per EDP Allen's note, "I did speak with patient's sister who states that the patient lives with the father who has mental issues and father and the patient have not been getting along. [employer] was called and placed pt under IVC due to him being danger to himself by being outside in cold". Pt is cooperative and oriented x 4. Pt is wearing scrubs and lying on his back in bed.  He reports he drank unknown amount of beer last night (see substance abuse details below). He says he lives by himself and has one year old daughter. 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 He denies any hx of outpatient or inpatient MH treatment. He sts that last night his father "had one of his episodes". Pt is referring to his father's anger outbursts d/t dx of schizophrenia or bipolar d/o. Pt says dad was dx w/ MI in 1990. Pt reports his hands and feet are numb and he says it must be from standing in the snow. Pt denies any particular reason for drinking or standing in snow last night. Pt denies SI currently or at any time in the past. Pt denies any history of suicide attempts and denies history of self-mutilation. Pt denies homicidal thoughts or physical aggression. Pt denies having access to firearms. Pt denies hallucinations. Pt does not appear to be responding to internal stimuli and exhibits no delusional thought. Pt's reality testing appears to be intact.  Diagnosis: Alcohol Intoxication  Past Medical History: History reviewed. No pertinent past medical history.  History reviewed. No pertinent surgical history.  Family History: No family history on file.  Social History:  reports that he has been smoking cigarettes.  He has been smoking about 1.00 pack per day. he has never used  smokeless tobacco. He reports that he drinks alcohol. He reports that he does not use drugs.  Additional Social History:  Alcohol / Drug Use Pain Medications: pt denies abuse - see pta meds list Prescriptions: pt denies abuse - see pta meds list Over the Counter: pt denies abuse - see pta meds list History of alcohol / drug use?: Yes Substance #1 Name of Substance 1: etoh 1 - Age of First Use: 13 1 - Amount (size/oz): "right much I reckon" 1 - Frequency: twice a week 1 - Duration: years 1 - Last Use / Amount: 06/06/17 - unknown quantity of beer  CIWA: CIWA-Ar BP: 128/86 Pulse Rate: 75 COWS:    Allergies: No Known Allergies  Home Medications:  (Not in a hospital admission)  OB/GYN Status:  No LMP for male patient.  General Assessment Data Location of Assessment: [facility] TTS Assessment: In system Is this a Tele or Face-to-Face Assessment?: Face-to-Face Is this an Initial Assessment or a Re-assessment for this encounter?: Initial Assessment Marital status: Single Maiden name: n/a Is patient pregnant?: No Pregnancy Status: No Living Arrangements: Alone Can pt return to current living arrangement?: Yes Admission Status: Involuntary Is patient capable of signing voluntary admission?: Yes Referral Source: Self/Family/Friend(gpd) Insurance type: self pay     Crisis Care Plan Living Arrangements: Alone Name of Psychiatrist: none Name of Therapist: none  Education Status Is patient currently in school?: No Highest grade of school patient has completed: 8  Risk to self with the past 6 months Suicidal Ideation: No Has patient been a risk to self within the past 6 months prior to admission? : No Suicidal Intent: No Has patient had any suicidal intent within the past 6 months prior to admission? : No Is patient at risk for suicide?: No Suicidal Plan?: No Has patient had any suicidal plan within the past 6 months prior to admission? : No Access to Means: No What has been  your use of drugs/alcohol within the last 12 months?: etoh use twice a week Previous Attempts/Gestures: No Other Self Harm Risks: none Triggers for Past Attempts: (n/a) Intentional Self Injurious Behavior: None Family Suicide History: No Recent stressful life event(s): Conflict (Comment)(father has mental illness) Persecutory voices/beliefs?: No Depression: No Substance abuse history and/or treatment for substance abuse?: No Suicide prevention information given to non-admitted patients: Not applicable  Risk to Others within the past 6 months Homicidal Ideation: No Does patient have any lifetime risk of violence toward others beyond the six months prior to admission? : No Thoughts of Harm to Others: No Current Homicidal Intent: No Current Homicidal Plan: No Access to Homicidal Means: No Identified Victim: none History of harm to others?: No Assessment of Violence: None Noted Violent Behavior Description: pt denies violence Does patient have access to weapons?: No Criminal Charges Pending?: Yes Describe Pending Criminal Charges: expired/no inspection Does patient have a court date: Yes Court Date: 06/20/17 Is patient on probation?: No  Psychosis Hallucinations: None noted Delusions: None noted  Mental Status Report Appearance/Hygiene: In scrubs, Unremarkable Eye Contact: Fair Motor Activity: Freedom of movement Speech: Logical/coherent Level of Consciousness: Alert, Quiet/awake Mood: Euthymic Affect: (euthymic) Anxiety Level: None Thought Processes: Coherent, Relevant Judgement: Unimpaired Orientation: Person, Place, Time, Situation Obsessive Compulsive Thoughts/Behaviors: None  Cognitive Functioning Concentration: Normal Memory: Recent Intact, Remote Intact IQ: Average Insight: Fair Impulse Control: Fair Appetite: Good Sleep: No Change Vegetative Symptoms: None  ADLScreening [facility] Assessment Services) Patient's cognitive ability adequate to safely complete  daily activities?: Yes Patient able to express need for assistance with ADLs?: Yes Independently performs ADLs?: Yes (appropriate for developmental age)  Prior Inpatient Therapy Prior Inpatient Therapy: No  Prior Outpatient Therapy Prior Outpatient Therapy: No Does patient have an ACCT team?: No Does patient have Intensive In-House Services?  : No Does patient have [facility] services? : No Does patient have P4CC services?: No  ADL Screening (condition at time of admission) Patient's cognitive ability adequate to safely complete daily activities?: Yes Is the patient deaf or have difficulty hearing?: No Does the patient have difficulty seeing, even when wearing glasses/contacts?: No Does the patient have difficulty concentrating, remembering, or making decisions?: No Patient able to express need for assistance with ADLs?: Yes Does the patient have difficulty dressing or bathing?: No Independently performs ADLs?: Yes (appropriate for developmental age) Does the patient have difficulty walking or climbing stairs?: No Weakness of Legs: None Weakness of Arms/Hands: None  Home Assistive Devices/Equipment Home Assistive Devices/Equipment: None    Abuse/Neglect Assessment (Assessment to be complete while patient is alone) Physical Abuse: Denies Verbal Abuse: Denies Sexual Abuse: Denies Exploitation of patient/patient's resources: Denies Self-Neglect: Denies     Merchant navy officer (For Healthcare) Does Patient Have a Medical Advance Directive?: No Would patient like information on creating a medical advance directive?: No - Patient declined    Additional Information 1:1 In Past 12 Months?: No CIRT Risk: No Elopement Risk: No Does patient have medical clearance?: Yes     Disposition:  Disposition Initial  Assessment Completed for this Encounter: Yes Disposition of Patient: Pending Review with psychiatrist  On Site Evaluation by:   Reviewed with Physician:    Donnamarie RossettiMCLEAN,  Marrianne Sica P 06/07/2017 7:52 AM

## 2017-06-07 NOTE — ED Notes (Signed)
Bed: WA26 Expected date:  Expected time:  Means of arrival:  Comments: 

## 2017-06-07 NOTE — ED Notes (Signed)
Pt currently sleeping. Will obtain vitals when pt is awake

## 2018-06-04 IMAGING — CT CT CERVICAL SPINE W/O CM
3 of 8 series · 11 of 33 positions shown, 12 images · non-contrast
Comparison: None.

CLINICAL DATA: Pain following fall from ladder

EXAM:
CT HEAD WITHOUT CONTRAST
CT CERVICAL SPINE WITHOUT CONTRAST
TECHNIQUE: Multidetector CT imaging of the head and cervical spine was
performed following the standard protocol without intravenous
contrast. Multiplanar CT image reconstructions of the cervical spine
were also generated.

[Series 6: coronal · coronal · 0.30mm/px · 3 of 80 slices shown]
[im 20/80  bone]
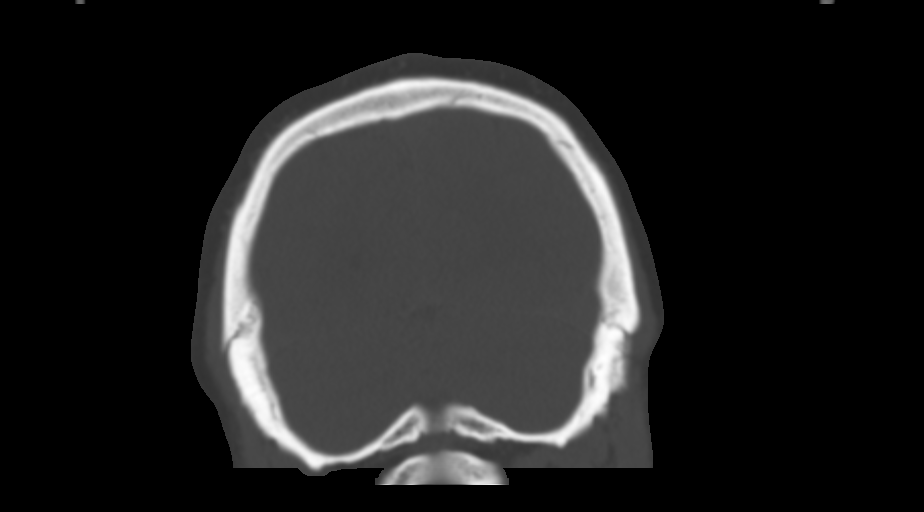
[im 40/80  bone]
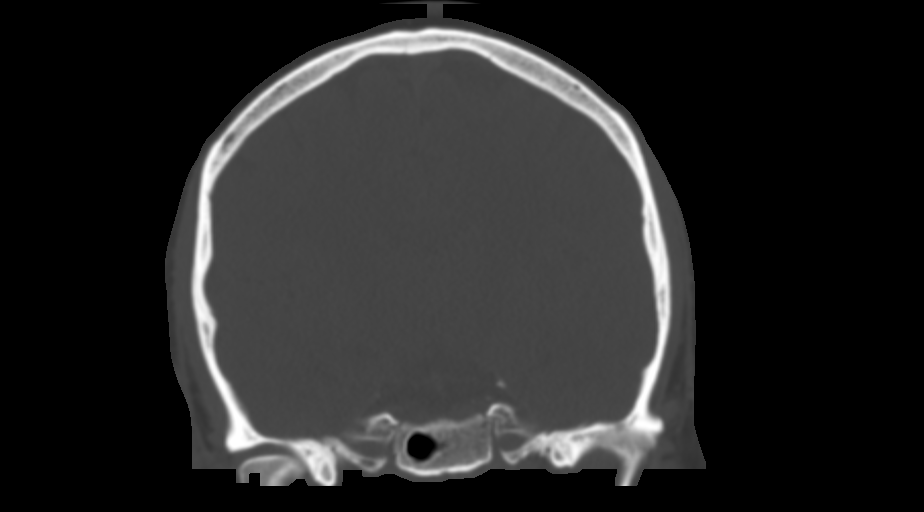
[im 60/80  bone]
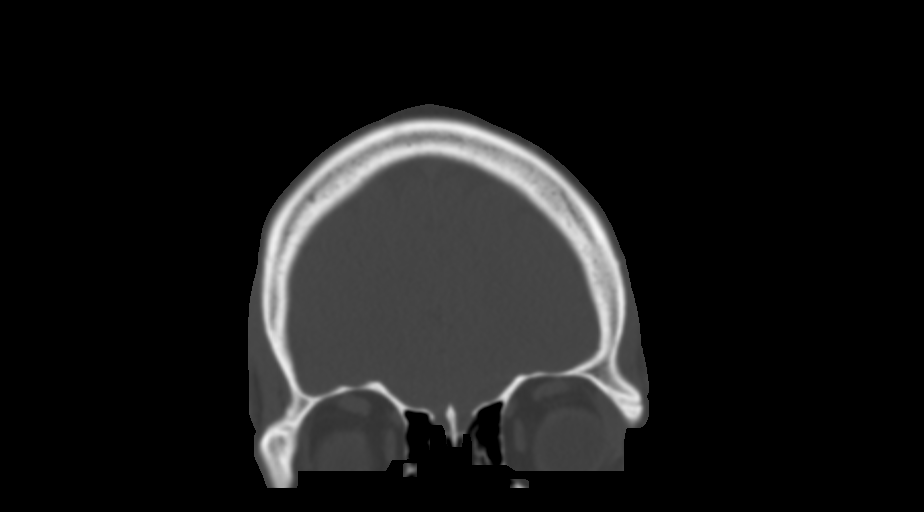

[Series 7: sagittal · sagittal · 0.29mm/px · 5 of 74 slices shown]
[im 13/74  bone]
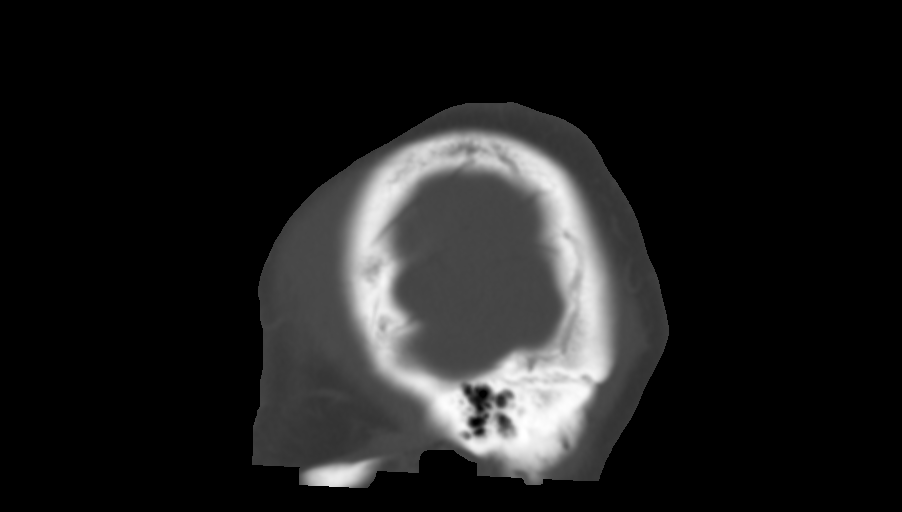
[im 25/74  bone]
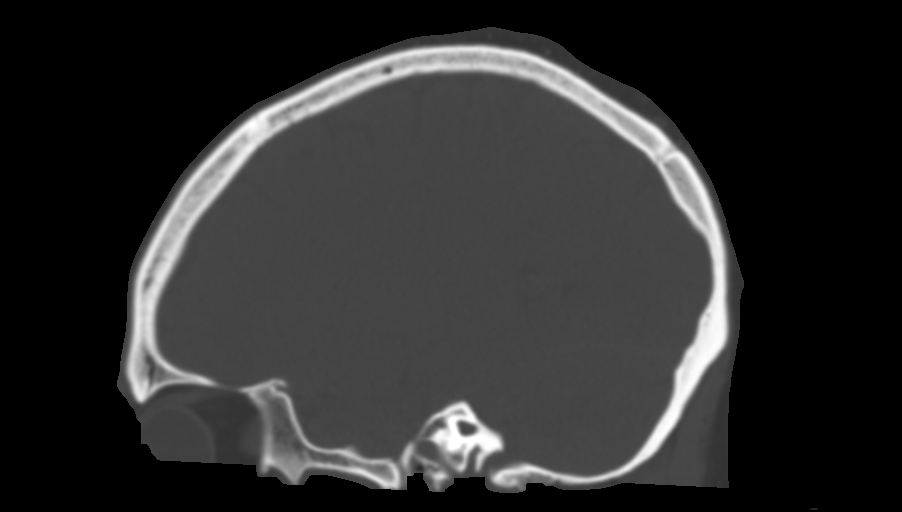
[im 37/74  bone]
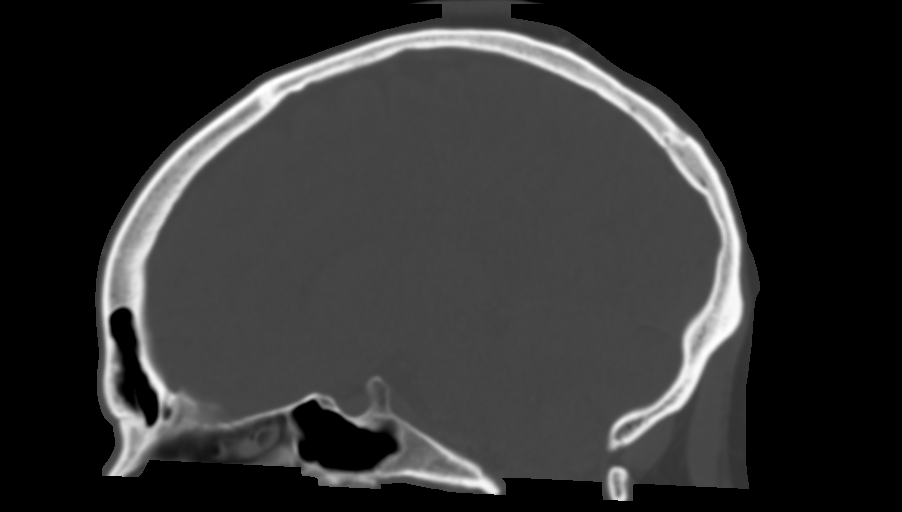
[im 49/74  bone]
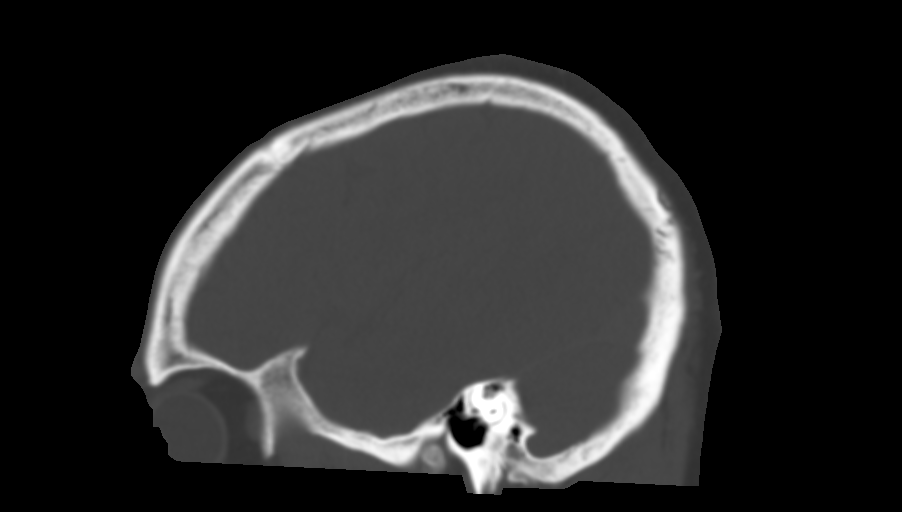
[im 61/74  bone]
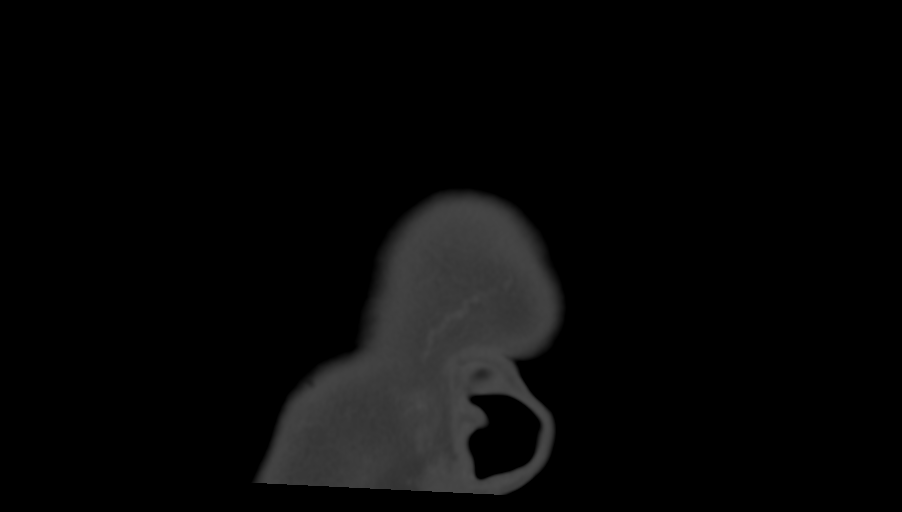

[Series 11: axial recon · axial · 0.23mm/px · z∈[-243,-146]mm · 3 of 105 slices shown, 4 images]
[im 27/105  soft-tissue]
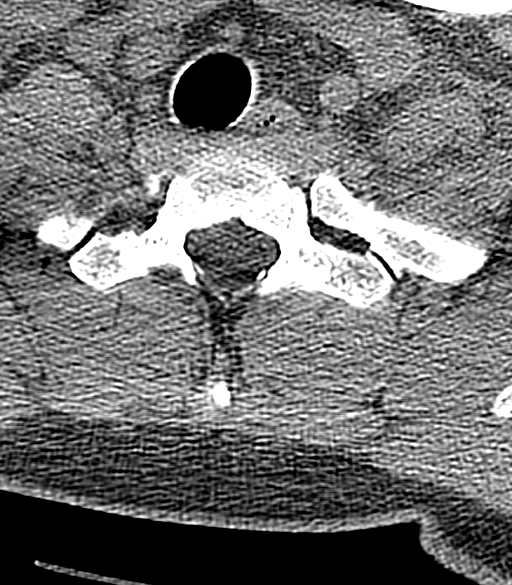
[im 27/105  bone]
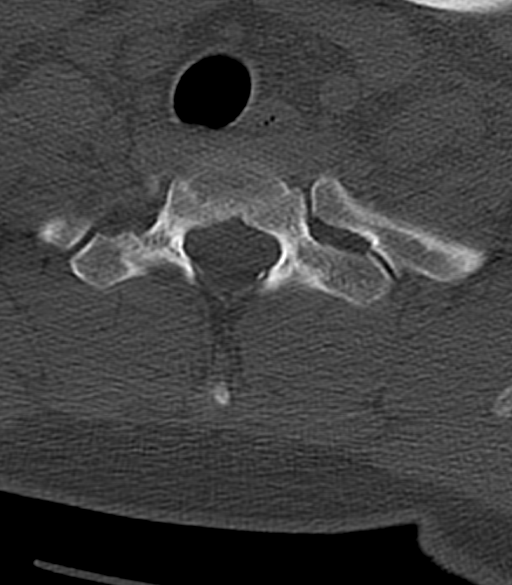
[im 53/105  bone]
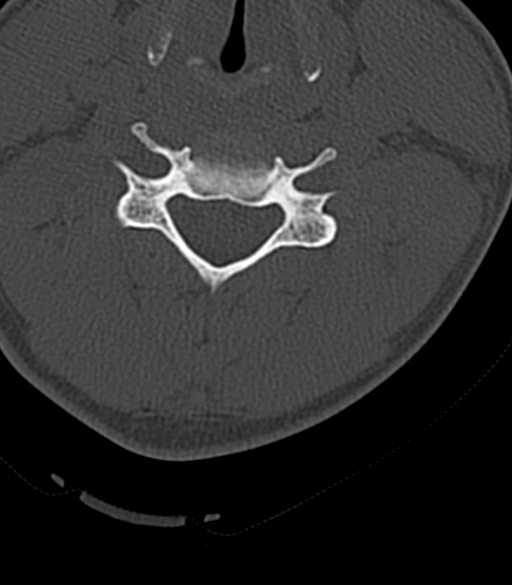
[im 79/105  bone]
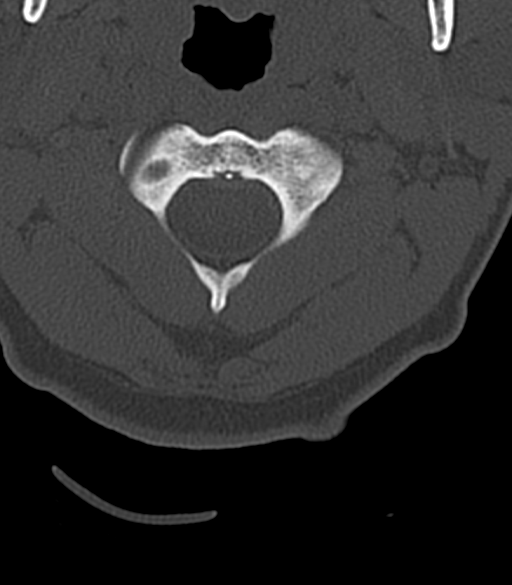

[11 of 33 positions shown; findings below may reference images not displayed]

FINDINGS: CT HEAD FINDINGS

Brain: The ventricles are normal in size and configuration. There is
no intracranial mass, hemorrhage, extra-axial fluid collection, or
midline shift. The gray-white compartments appear normal. No evident
acute infarct.

Vascular: No hyperdense vessel. No vascular calcifications are
appreciable.

Skull: Bony calvarium appears intact.

Sinuses/Orbits: Visualized paranasal sinuses are clear. Visualized
orbits appear symmetric bilaterally.

Other: Mastoid air cells are clear.

CT CERVICAL SPINE FINDINGS

Alignment: There is no spondylolisthesis.

Skull base and vertebrae: Skull base and craniocervical junction
regions appear normal. No evident fracture. No blastic or lytic bone
lesions.

Soft tissues and spinal canal: Prevertebral soft tissues and
predental space regions are normal. No appreciable paraspinous
lesion. No cord or canal hematoma.

Disc levels: Disc spaces appear unremarkable. No nerve root edema or
effacement. No disc extrusion or stenosis.

Upper chest: Visualized upper lung regions appear clear.

Other: None
IMPRESSION: CT head:  Study within normal limits.

CT cervical spine: No fracture or spondylolisthesis. No evident
arthropathy.

## 2019-03-11 ENCOUNTER — Encounter (HOSPITAL_BASED_OUTPATIENT_CLINIC_OR_DEPARTMENT_OTHER): Payer: Self-pay | Admitting: Emergency Medicine

## 2019-03-11 ENCOUNTER — Emergency Department (HOSPITAL_BASED_OUTPATIENT_CLINIC_OR_DEPARTMENT_OTHER)
Admission: EM | Admit: 2019-03-11 | Discharge: 2019-03-11 | Disposition: A | Discharge status: Home / Self Care | Payer: Self-pay | Attending: Emergency Medicine | Admitting: Emergency Medicine

## 2019-03-11 ENCOUNTER — Other Ambulatory Visit: Payer: Self-pay

## 2019-03-11 DIAGNOSIS — Z79899 Other long term (current) drug therapy: Secondary | ICD-10-CM | POA: Insufficient documentation

## 2019-03-11 DIAGNOSIS — R369 Urethral discharge, unspecified: Secondary | ICD-10-CM | POA: Insufficient documentation

## 2019-03-11 DIAGNOSIS — F17210 Nicotine dependence, cigarettes, uncomplicated: Secondary | ICD-10-CM | POA: Insufficient documentation

## 2019-03-11 LAB — URINALYSIS, ROUTINE W REFLEX MICROSCOPIC
Bilirubin Urine: NEGATIVE
Glucose, UA: NEGATIVE mg/dL
Hgb urine dipstick: NEGATIVE
Ketones, ur: NEGATIVE mg/dL
Leukocytes,Ua: NEGATIVE
Nitrite: NEGATIVE
Protein, ur: NEGATIVE mg/dL
Specific Gravity, Urine: 1.01 (ref 1.005–1.030)
pH: 7 (ref 5.0–8.0)

## 2019-03-11 MED ORDER — CEFTRIAXONE SODIUM 250 MG IJ SOLR
250.0000 mg | Freq: Once | INTRAMUSCULAR | Status: AC
  Administered 2019-03-11: 250 mg via INTRAMUSCULAR
  Filled 2019-03-11: qty 250

## 2019-03-11 MED ORDER — LIDOCAINE HCL (PF) 1 % IJ SOLN
INTRAMUSCULAR | Status: AC
Start: 2019-03-11 — End: 2019-03-11
  Administered 2019-03-11: 09:00:00 5 mL
  Filled 2019-03-11: qty 5

## 2019-03-11 MED ORDER — AZITHROMYCIN 250 MG PO TABS
1000.0000 mg | ORAL_TABLET | Freq: Once | ORAL | Status: AC
  Administered 2019-03-11: 09:00:00 1000 mg via ORAL
  Filled 2019-03-11: qty 4

## 2019-03-11 NOTE — ED Triage Notes (Signed)
D/C from penis x 2-3 days

## 2019-03-11 NOTE — ED Provider Notes (Signed)
[facility] DEPARTMENT Provider Note   CSN: [id number] Arrival date & time: 03/11/19  0827     History   Chief Complaint Chief Complaint  Patient presents with  . SEXUALLY TRANSMITTED DISEASE    HPI Erik Patterson is a 28 y.o. male.  He has no significant past medical history.  He is complaining of 3 to 4 days of some discomfort at the tip of his penis and some white discharge.  He has had a new sexual contact.  No prior history of STDs.  Denies any fevers or other illness symptoms.     The history is provided by the patient.  Male GU Problem Presenting symptoms: penile discharge and penile pain   Presenting symptoms: no scrotal pain   Context: spontaneously   Relieved by:  None tried Worsened by:  Nothing Ineffective treatments:  None tried Associated symptoms: penile redness   Associated symptoms: no abdominal pain, no fever, no penile swelling, no priapism, no urinary incontinence and no vomiting   Risk factors: recent sexual activity and unprotected sex   Risk factors: no HIV     No past medical history on file.  Patient Active Problem List   Diagnosis Date Noted  . Substance induced mood disorder ([place]) 06/07/2017    No past surgical history on file.      Home Medications    Prior to Admission medications   Medication Sig Start Date End Date Taking? Authorizing Provider  ibuprofen (ADVIL,MOTRIN) 600 MG tablet Take 1 tablet (600 mg total) by mouth every 6 (six) hours as needed. Patient not taking: Reported on 06/06/2017 02/11/17   Joy, Shawn C, PA-C  lidocaine (LIDODERM) 5 % Place 1 patch onto the skin daily. Remove & Discard patch within 12 hours or as directed by MD Patient not taking: Reported on 06/06/2017 02/11/17   Joy, Helane Gunther, PA-C  methocarbamol (ROBAXIN) 500 MG tablet Take 1 tablet (500 mg total) by mouth 2 (two) times daily. Patient not taking: Reported on 06/06/2017 02/11/17   Lorayne Bender, PA-C    Family History No family history  on file.  Social History Social History   Tobacco Use  . Smoking status: Current Every Day Smoker    Packs/day: 1.00    Types: Cigarettes  . Smokeless tobacco: Never Used  Substance Use Topics  . Alcohol use: Yes  . Drug use: No     Allergies   Patient has no known allergies.   Review of Systems Review of Systems  Constitutional: Negative for fever.  Gastrointestinal: Negative for abdominal pain and vomiting.  Genitourinary: Positive for discharge and penile pain. Negative for bladder incontinence and penile swelling.     Physical Exam Updated Vital Signs BP (!) 159/106 (BP Location: Right Arm)   Pulse 95   Temp 98.1 F (36.7 C) (Oral)   Resp 18   Ht 5\' 8"  (1.727 m)   Wt 102.1 kg   SpO2 100%   BMI 34.21 kg/m   Physical Exam Vitals signs and nursing note reviewed. Exam conducted with a chaperone present.  Constitutional:      Appearance: He is well-developed.  HENT:     Head: Normocephalic and atraumatic.  Eyes:     Conjunctiva/sclera: Conjunctivae normal.  Neck:     Musculoskeletal: Neck supple.  Pulmonary:     Effort: Pulmonary effort is normal.  Genitourinary:    Penis: Circumcised. Erythema (at urethral meatus) and discharge present.      Scrotum/Testes: Normal.  Skin:    General: Skin is warm and dry.  Neurological:     Mental Status: He is alert.     GCS: GCS eye subscore is 4. GCS verbal subscore is 5. GCS motor subscore is 6.      ED Treatments / Results  Labs (all labs ordered are listed, but only abnormal results are displayed) Labs Reviewed  URINALYSIS, ROUTINE W REFLEX MICROSCOPIC  RPR  HIV ANTIBODY (ROUTINE TESTING W REFLEX)  GC/CHLAMYDIA PROBE AMP ([place]) NOT AT [facility]    EKG None  Radiology No results found.  Procedures Procedures (including critical care time)  Medications Ordered in ED Medications  cefTRIAXone (ROCEPHIN) injection 250 mg (has no administration in time range)  azithromycin (ZITHROMAX) tablet  1,000 mg (has no administration in time range)     Initial Impression / Assessment and Plan / ED Course  I have reviewed the triage vital signs and the nursing notes.  Pertinent labs & imaging results that were available during my care of the patient were reviewed by me and considered in my medical decision making (see chart for details).  Clinical Course as of Mar 11 911  Sat Mar 11, 2019  [id number] 28 year old male here with penile discharge after unprotected sex.  Differential includes gonorrhea chlamydia.  We will also test for HIV and syphilis.  Empiric antibiotics.  Patient understands to use barrier protection   [MB]    Clinical Course User Index [MB] Terrilee FilesButler, Khristian Phillippi C, MD       Final Clinical Impressions(s) / ED Diagnoses   Final diagnoses:  Penile discharge    ED Discharge Orders    None       Terrilee FilesButler, Salif Tay C, MD 03/11/19 [phone]

## 2019-03-11 NOTE — ED Notes (Signed)
Given po fluids given, unable to u/a at this time

## 2019-03-11 NOTE — Discharge Instructions (Signed)
You were seen in the emergency department for discharge from the tip of your penis.  This is likely an STD and you were given antibiotics to treat for gonorrhea and chlamydia.  You were also tested for HIV and syphilis.  We will contact you if your tests are positive.  If you are positive you should inform any of your partners that may have been with you recently.  Please use condoms.  Return to the emergency department for any worsening symptoms.

## 2019-03-12 LAB — RPR: RPR Ser Ql: NONREACTIVE

## 2019-03-13 LAB — HIV ANTIBODY (ROUTINE TESTING W REFLEX): HIV Screen 4th Generation wRfx: NONREACTIVE

## 2019-03-14 LAB — GC/CHLAMYDIA PROBE AMP (~~LOC~~) NOT AT ARMC
Chlamydia: NEGATIVE
Neisseria Gonorrhea: POSITIVE — AB

## 2022-09-03 ENCOUNTER — Ambulatory Visit
Admission: EM | Admit: 2022-09-03 | Discharge: 2022-09-03 | Disposition: A | Discharge status: Home / Self Care | Payer: Self-pay | Attending: Family Medicine | Admitting: Family Medicine

## 2022-09-03 DIAGNOSIS — I10 Essential (primary) hypertension: Secondary | ICD-10-CM

## 2022-09-03 MED ORDER — LISINOPRIL 10 MG PO TABS: 10.0000 mg | ORAL_TABLET | Freq: Every day | ORAL | 1 refills | Status: AC

## 2022-09-03 MED ORDER — IBUPROFEN 800 MG PO TABS
800.0000 mg | ORAL_TABLET | Freq: Once | ORAL | Status: AC
  Administered 2022-09-03: 800 mg via ORAL

## 2022-09-03 NOTE — Discharge Instructions (Addendum)
Lisinopril 10 mg--1 tablet daily for blood pressure  Goal for your blood pressure is < 140/90.  We have drawn blood to check your sodium and potassium, sugar, and kidney function numbers.  If anything is significantly abnormal, staff will notify you by phone.  You can use the QR code/website at the back of the summary paperwork to schedule yourself a new patient appointment with primary care

## 2022-09-03 NOTE — ED Provider Notes (Addendum)
[name]    CSN: [id number] Arrival date & time: 09/03/22  [id number]      History   Chief Complaint Chief Complaint  Patient presents with   Hypertension    HPI Erik Patterson is a 32 y.o. male.    Hypertension   Here for elevated bp and mild h/a.  He checked his blood pressure yesterday at his aunts house and it was elevated at 190/70.  He decided to come in today and get checked here.  Blood pressure here is 146/100.  He has had some mild headache, though is not sure if that is due to blood pressure or allergies.  No fever and no swelling or chest pain or shortness of breath.  Family history is significant for several people with hypertension.  He states he quit smoking cigarettes recently  History reviewed. No pertinent past medical history.  Patient Active Problem List   Diagnosis Date Noted   Substance induced mood disorder ([place]) 06/07/2017    History reviewed. No pertinent surgical history.     Home Medications    Prior to Admission medications   Medication Sig Start Date End Date Taking? Authorizing Provider  lisinopril (ZESTRIL) 10 MG tablet Take 1 tablet (10 mg total) by mouth daily. 09/03/22  Yes Barrett Henle, MD    Family History History reviewed. No pertinent family history.  Social History Social History   Tobacco Use   Smoking status: Every Day    Packs/day: 1.00    Types: Cigarettes   Smokeless tobacco: Never  Substance Use Topics   Alcohol use: Yes    Comment: weekends   Drug use: No     Allergies   Shellfish allergy   Review of Systems Review of Systems   Physical Exam Triage Vital Signs ED Triage Vitals [09/03/22 0822]  Enc Vitals Group     BP (!) 146/100     Pulse Rate 99     Resp 16     Temp 97.8 F (36.6 C)     Temp Source Oral     SpO2 97 %     Weight      Height      Head Circumference      Peak Flow      Pain Score 0     Pain Loc      Pain Edu?      Excl. in [place]?    No data  found.  Updated Vital Signs BP (!) 146/100 (BP Location: Left Arm)   Pulse 99   Temp 97.8 F (36.6 C) (Oral)   Resp 16   SpO2 97%   Visual Acuity Right Eye Distance:   Left Eye Distance:   Bilateral Distance:    Right Eye Near:   Left Eye Near:    Bilateral Near:     Physical Exam Vitals reviewed.  Constitutional:      General: He is not in acute distress.    Appearance: He is not ill-appearing, toxic-appearing or diaphoretic.  HENT:     Mouth/Throat:     Mouth: Mucous membranes are moist.  Eyes:     Extraocular Movements: Extraocular movements intact.     Conjunctiva/sclera: Conjunctivae normal.     Pupils: Pupils are equal, round, and reactive to light.  Cardiovascular:     Rate and Rhythm: Normal rate and regular rhythm.     Heart sounds: No murmur heard. Pulmonary:     Effort: Pulmonary effort is normal.  Breath sounds: Normal breath sounds.  Musculoskeletal:     Cervical back: Neck supple.     Right lower leg: No edema.     Left lower leg: No edema.  Lymphadenopathy:     Cervical: No cervical adenopathy.  Skin:    Coloration: Skin is not jaundiced or pale.  Neurological:     General: No focal deficit present.  Psychiatric:        Behavior: Behavior normal.      UC Treatments / Results  Labs (all labs ordered are listed, but only abnormal results are displayed) Labs Reviewed  BASIC METABOLIC PANEL    EKG   Radiology No results found.  Procedures Procedures (including critical care time)  Medications Ordered in UC Medications - No data to display  Initial Impression / Assessment and Plan / UC Course  I have reviewed the triage vital signs and the nursing notes.  Pertinent labs & imaging results that were available during my care of the patient were reviewed by me and considered in my medical decision making (see chart for details).        I am going to begin lisinopril 10 mg 1 daily.  He is given instructions on how to self  schedule on the [facility] health website and assistance is requested to help him find a PCP.  BMP is drawn today and we will let him know if anything is significantly abnormal.  Requested something for headache before he was discharged.  Ibuprofen administered here Final Clinical Impressions(s) / UC Diagnoses   Final diagnoses:  Essential hypertension     Discharge Instructions      Lisinopril 10 mg--1 tablet daily for blood pressure  Goal for your blood pressure is < 140/90.  We have drawn blood to check your sodium and potassium, sugar, and kidney function numbers.  If anything is significantly abnormal, staff will notify you by phone.  You can use the QR code/website at the back of the summary paperwork to schedule yourself a new patient appointment with primary care      ED Prescriptions     Medication Sig Dispense Auth. Provider   lisinopril (ZESTRIL) 10 MG tablet Take 1 tablet (10 mg total) by mouth daily. 30 tablet Trooper Olander, Gwenlyn Perking, MD      PDMP not reviewed this encounter.   Barrett Henle, MD 09/03/22 [id number]    Barrett Henle, MD 09/03/22 [phone]

## 2022-09-03 NOTE — ED Triage Notes (Signed)
Pt states he took his BP yesterday at his aunts house and it was high.  States he is coming in to have it checked out.  Pt denies any symptoms or history of hypertension.

## 2022-09-04 LAB — BASIC METABOLIC PANEL
BUN/Creatinine Ratio: 10 (ref 9–20)
BUN: 8 mg/dL (ref 6–20)
CO2: 16 mmol/L — ABNORMAL LOW (ref 20–29)
Calcium: 10.1 mg/dL (ref 8.7–10.2)
Chloride: 107 mmol/L — ABNORMAL HIGH (ref 96–106)
Creatinine, Ser: 0.83 mg/dL (ref 0.76–1.27)
Glucose: 84 mg/dL (ref 70–99)
Potassium: 4.5 mmol/L (ref 3.5–5.2)
Sodium: 142 mmol/L (ref 134–144)
eGFR: 119 mL/min/{1.73_m2} (ref >59)

## 2024-01-31 ENCOUNTER — Ambulatory Visit
Admission: EM | Admit: 2024-01-31 | Discharge: 2024-01-31 | Disposition: A | Discharge status: Home / Self Care | Payer: Self-pay | Attending: Emergency Medicine | Admitting: Emergency Medicine

## 2024-01-31 ENCOUNTER — Encounter: Payer: Self-pay | Admitting: Emergency Medicine

## 2024-01-31 DIAGNOSIS — L731 Pseudofolliculitis barbae: Secondary | ICD-10-CM

## 2024-01-31 MED ORDER — MUPIROCIN 2 % EX OINT: 1.0000 | TOPICAL_OINTMENT | Freq: Two times a day (BID) | CUTANEOUS | 0 refills | Status: AC

## 2024-01-31 NOTE — ED Triage Notes (Signed)
 Pt presents c/o abscess on back of head x 1 week. Pt says the area is not painful but is tender to touch.

## 2024-01-31 NOTE — ED Provider Notes (Signed)
 [name]    CSN: [id number] Arrival date & time: 01/31/24  1613      History   Chief Complaint Chief Complaint  Patient presents with   Abscess    HPI Sung Walth is a 33 y.o. male.  1 week history of bump on the back of the head Slightly sore only if he presses on it. No drainage No fevers  He shaves his head. Last was 2 days ago.   History reviewed. No pertinent past medical history.  Patient Active Problem List   Diagnosis Date Noted   Substance induced mood disorder (HCC) 06/07/2017    History reviewed. No pertinent surgical history.     Home Medications    Prior to Admission medications   Medication Sig Start Date End Date Taking? Authorizing Provider  mupirocin  ointment (BACTROBAN ) 2 % Apply 1 Application topically 2 (two) times daily. 01/31/24  Yes Suzan Manon, [place], PA-C  lisinopril  (ZESTRIL ) 10 MG tablet Take 1 tablet (10 mg total) by mouth daily. 09/03/22   Vonna Sharlet POUR, MD    Family History History reviewed. No pertinent family history.  Social History Social History   Tobacco Use   Smoking status: Former    Current packs/day: 1.00    Types: Cigarettes    Passive exposure: Past   Smokeless tobacco: Never  Vaping Use   Vaping status: Never Used  Substance Use Topics   Alcohol use: Yes    Comment: weekends   Drug use: No     Allergies   Shellfish allergy   Review of Systems Review of Systems  As per HPI  Physical Exam Triage Vital Signs ED Triage Vitals  Encounter Vitals Group     BP 01/31/24 1647 132/78     Girls Systolic BP Percentile --      Girls Diastolic BP Percentile --      Boys Systolic BP Percentile --      Boys Diastolic BP Percentile --      Pulse Rate 01/31/24 1647 75     Resp 01/31/24 1647 16     Temp 01/31/24 1647 98.4 F (36.9 C)     Temp Source 01/31/24 1647 Oral     SpO2 01/31/24 1647 98 %     Weight 01/31/24 1645 225 lb 1.4 oz (102.1 kg)     Height --      Head Circumference --       Peak Flow --      Pain Score 01/31/24 1645 2     Pain Loc --      Pain Education --      Exclude from Growth Chart --    No data found.  Updated Vital Signs BP 132/78 (BP Location: Left Arm)   Pulse 75   Temp 98.4 F (36.9 C) (Oral)   Resp 16   Wt 225 lb 1.4 oz (102.1 kg)   SpO2 98%   BMI 34.22 kg/m   Physical Exam Vitals and nursing note reviewed.  Constitutional:      General: He is not in acute distress.    Appearance: Normal appearance.  HENT:     Head:      Comments: Small area of induration (< 1 cm) on the posterior head. Not erythematous, hot, or tender.    Mouth/Throat:     Pharynx: Oropharynx is clear.  Cardiovascular:     Rate and Rhythm: Normal rate and regular rhythm.     Heart sounds: Normal heart sounds.  Pulmonary:  Effort: Pulmonary effort is normal.     Breath sounds: Normal breath sounds.  Neurological:     Mental Status: He is alert and oriented to person, place, and time.     UC Treatments / Results  Labs (all labs ordered are listed, but only abnormal results are displayed) Labs Reviewed - No data to display  EKG  Radiology No results found.  Procedures Procedures  Medications Ordered in UC Medications - No data to display  Initial Impression / Assessment and Plan / UC Course  I have reviewed the triage vital signs and the nursing notes.  Pertinent labs & imaging results that were available during my care of the patient were reviewed by me and considered in my medical decision making (see chart for details).  Not a true abscess Likely irritation or ingrown hair from shaving the head Recommend keeping clean with soap and water. Bactroban  BID. Advised not to manipulate the area. Can return if needed No questions   Final Clinical Impressions(s) / UC Diagnoses   Final diagnoses:  Ingrown hair     Discharge Instructions      Keep area clean with regular soap and water Apply Bactroban  ointment twice daily Do not  squeeze, pinch, press or manipulate the area as this will not allow healing. I also do not recommend shaving the area until the bump is completely gone.  Please return if needed     ED Prescriptions     Medication Sig Dispense Auth. Provider   mupirocin  ointment (BACTROBAN ) 2 % Apply 1 Application topically 2 (two) times daily. 15 g Nadyne Gariepy, [place], PA-C      PDMP not reviewed this encounter.   Jeryl [place], [place] 01/31/24 1750

## 2024-01-31 NOTE — Discharge Instructions (Signed)
 Keep area clean with regular soap and water Apply Bactroban  ointment twice daily Do not squeeze, pinch, press or manipulate the area as this will not allow healing. I also do not recommend shaving the area until the bump is completely gone.  Please return if needed
